# Patient Record
Sex: Male | Born: 1980 | Race: White | Hispanic: No | Marital: Married | Smoking: Current some day smoker
Health system: Southern US, Community
[De-identification: ages and names within clinical notes are randomized; demographics above are authoritative.]

## PROBLEM LIST (undated history)

## (undated) DIAGNOSIS — F419 Anxiety disorder, unspecified: Secondary | ICD-10-CM

## (undated) DIAGNOSIS — J45909 Unspecified asthma, uncomplicated: Secondary | ICD-10-CM

## (undated) HISTORY — PX: TOOTH EXTRACTION: SUR596

## (undated) HISTORY — PX: FRACTURE SURGERY: SHX138

## (undated) HISTORY — DX: Anxiety disorder, unspecified: F41.9

## (undated) HISTORY — DX: Unspecified asthma, uncomplicated: J45.909

---

## 1998-01-02 ENCOUNTER — Emergency Department (HOSPITAL_COMMUNITY): Admission: EM | Admit: 1998-01-02 | Discharge: 1998-01-02 | Payer: Self-pay | Admitting: Internal Medicine

## 1998-01-04 ENCOUNTER — Encounter: Admission: RE | Admit: 1998-01-04 | Discharge: 1998-04-04 | Payer: Self-pay | Admitting: Internal Medicine

## 1998-01-04 ENCOUNTER — Encounter: Admission: RE | Admit: 1998-01-04 | Discharge: 1998-01-04 | Payer: Self-pay | Admitting: *Deleted

## 1998-06-29 ENCOUNTER — Emergency Department (HOSPITAL_COMMUNITY): Admission: EM | Admit: 1998-06-29 | Discharge: 1998-06-30 | Payer: Self-pay | Admitting: Emergency Medicine

## 2000-01-24 ENCOUNTER — Emergency Department (HOSPITAL_COMMUNITY): Admission: EM | Admit: 2000-01-24 | Discharge: 2000-01-24 | Payer: Self-pay | Admitting: Emergency Medicine

## 2001-12-04 ENCOUNTER — Emergency Department (HOSPITAL_COMMUNITY): Admission: EM | Admit: 2001-12-04 | Discharge: 2001-12-04 | Payer: Self-pay | Admitting: Emergency Medicine

## 2002-01-07 ENCOUNTER — Encounter: Payer: Self-pay | Admitting: Orthopedic Surgery

## 2002-01-07 ENCOUNTER — Encounter: Admission: RE | Admit: 2002-01-07 | Discharge: 2002-01-07 | Payer: Self-pay | Admitting: Orthopedic Surgery

## 2004-03-31 ENCOUNTER — Emergency Department (HOSPITAL_COMMUNITY): Admission: EM | Admit: 2004-03-31 | Discharge: 2004-03-31 | Payer: Self-pay | Admitting: *Deleted

## 2006-10-19 ENCOUNTER — Encounter: Admission: RE | Admit: 2006-10-19 | Discharge: 2006-10-19 | Payer: Self-pay | Admitting: Endocrinology

## 2011-05-29 ENCOUNTER — Other Ambulatory Visit: Payer: Self-pay | Admitting: Family Medicine

## 2011-05-29 DIAGNOSIS — N5089 Other specified disorders of the male genital organs: Secondary | ICD-10-CM

## 2011-06-05 ENCOUNTER — Ambulatory Visit
Admission: RE | Admit: 2011-06-05 | Discharge: 2011-06-05 | Disposition: A | Discharge status: Home / Self Care | Payer: 59 | Source: Ambulatory Visit | Attending: Family Medicine | Admitting: Family Medicine

## 2011-06-05 DIAGNOSIS — N5089 Other specified disorders of the male genital organs: Secondary | ICD-10-CM

## 2013-01-02 ENCOUNTER — Telehealth: Payer: Self-pay

## 2013-01-02 ENCOUNTER — Ambulatory Visit (INDEPENDENT_AMBULATORY_CARE_PROVIDER_SITE_OTHER): Payer: 59 | Admitting: Family Medicine

## 2013-01-02 VITALS — BP 125/68 | HR 61 | Temp 97.5°F | Resp 18 | Ht 70.25 in | Wt 190.4 lb

## 2013-01-02 DIAGNOSIS — K0889 Other specified disorders of teeth and supporting structures: Secondary | ICD-10-CM

## 2013-01-02 DIAGNOSIS — K089 Disorder of teeth and supporting structures, unspecified: Secondary | ICD-10-CM

## 2013-01-02 DIAGNOSIS — R55 Syncope and collapse: Secondary | ICD-10-CM

## 2013-01-02 DIAGNOSIS — K047 Periapical abscess without sinus: Secondary | ICD-10-CM

## 2013-01-02 DIAGNOSIS — G4700 Insomnia, unspecified: Secondary | ICD-10-CM

## 2013-01-02 MED ORDER — OXYCODONE-ACETAMINOPHEN 10-325 MG PO TABS: 1.0000 | ORAL_TABLET | Freq: Three times a day (TID) | ORAL | Status: DC | PRN

## 2013-01-02 MED ORDER — AMOXICILLIN 500 MG PO CAPS: 500.0000 mg | ORAL_CAPSULE | Freq: Three times a day (TID) | ORAL | Status: DC

## 2013-01-02 MED ORDER — AMOXICILLIN 500 MG PO CAPS: 500.0000 mg | ORAL_CAPSULE | Freq: Two times a day (BID) | ORAL | Status: DC

## 2013-01-02 MED ORDER — ZOLPIDEM TARTRATE 5 MG PO TABS: 5.0000 mg | ORAL_TABLET | Freq: Every evening | ORAL | Status: DC | PRN

## 2013-01-02 MED ORDER — LIDOCAINE VISCOUS 2 % MT SOLN: 20.0000 mL | OROMUCOSAL | Status: DC | PRN

## 2013-01-02 NOTE — Progress Notes (Signed)
[facility]:  Office Visit  Chief Complaint:  Chief Complaint  Patient presents with  . Dental Pain    R lower x 2 dys    HPI: Justin Underwood is a 32 y.o. male who complains of  Right tooth pain, sees Dr Maurice March and associates. Has had cavities with fillings and has multipe procedures recently but not working so dentist recommended root canal, and is supposed to get emergency root canal on Monday. Can't eat, can't sleep. Has tried tylenol, tylox, unisom, oragel, advil without relief. He has had pain before with broken arms, cut his finger but nothing has hurt him like this. He had has had chills, no fevers, + drainage and jaw pain and mild swelling  Past Medical History  Diagnosis Date  . Asthma    Past Surgical History  Procedure Laterality Date  . Fracture surgery     History   Social History  . Marital Status: Single    Spouse Name: N/A    Number of Children: N/A  . Years of Education: N/A   Social History Main Topics  . Smoking status: Never Smoker   . Smokeless tobacco: None  . Alcohol Use: Yes     Comment: socially  . Drug Use: No  . Sexually Active: Yes   Other Topics Concern  . None   Social History Narrative  . None   History reviewed. No pertinent family history. No Known Allergies Prior to Admission medications   Not on File     ROS: The patient denies night sweats, unintentional weight loss, chest pain, palpitations, wheezing, dyspnea on exertion, nausea, vomiting, abdominal pain, dysuria, hematuria, melena, numbness, weakness, or tingling.   All other systems have been reviewed and were otherwise negative with the exception of those mentioned in the HPI and as above.    PHYSICAL EXAM: Filed Vitals:   01/02/13 0750  BP: 125/68  Pulse: 61  Temp: 97.5 F (36.4 C)  Resp: 18   Filed Vitals:   01/02/13 0750  Height: 5' 10.25" (1.784 m)  Weight: 190 lb 6.4 oz (86.365 kg)   Body mass index is 27.14 kg/(m^2).  General: Alert,  no acute distress HEENT:  Normocephalic, atraumatic, oropharynx patent. + right lower molar cavity with filling, yellow drainage on tongue, tender jaw. Cardiovascular:  Regular rate and rhythm, no rubs murmurs or gallops.  No pedal edema.  Respiratory: Clear to auscultation bilaterally.  No wheezes, rales, or rhonchi.  No cyanosis, no use of accessory musculature GI: No organomegaly, abdomen is soft and non-tender, positive bowel sounds.  No masses. Skin: No rashes. Neurologic: Facial musculature symmetric. Psychiatric: Patient is appropriate throughout our interaction. Lymphatic: No cervical lymphadenopathy Musculoskeletal: Gait intact.   LABS: No results found for this or any previous visit.   EKG/XRAY:   Primary read interpreted by Dr. Conley Rolls at [facility].   ASSESSMENT/PLAN: Encounter Diagnoses  Name Primary?  Marland Kitchen Tooth abscess Yes  . Tooth pain   . Insomnia    Advise to go see dentist on Monday as planned Will rx Amox 500 mg BID for infection prevention Rx ambien for sleep prn Rx Oxycodone/Acetaminophen for pain prn Rx viscous lidocaine 2% for topical pain prn Gross sideeffects, risk and benefits, and alternatives of medications d/w patient. Patient is aware that all medications have potential sideeffects and we are unable to predict every sideeffect or drug-drug interaction that may occur. F/u prn   Lemar Bakos PHUONG, DO 01/02/2013 8:23 AM

## 2013-01-02 NOTE — Telephone Encounter (Signed)
Pharmacy called stating that they received two rx of amoxicillian with two different directions and they need clarification  Best number [phone]

## 2013-01-02 NOTE — Telephone Encounter (Signed)
Pharmacy notified amox bid is correct dose.

## 2013-04-15 ENCOUNTER — Encounter: Payer: Self-pay | Admitting: Radiology

## 2013-04-15 DIAGNOSIS — L989 Disorder of the skin and subcutaneous tissue, unspecified: Secondary | ICD-10-CM | POA: Insufficient documentation

## 2013-05-06 ENCOUNTER — Ambulatory Visit (INDEPENDENT_AMBULATORY_CARE_PROVIDER_SITE_OTHER): Payer: 59 | Admitting: Emergency Medicine

## 2013-05-06 VITALS — BP 126/80 | HR 59 | Temp 98.3°F | Resp 16 | Ht 71.5 in | Wt 189.0 lb

## 2013-05-06 DIAGNOSIS — H9190 Unspecified hearing loss, unspecified ear: Secondary | ICD-10-CM

## 2013-05-06 DIAGNOSIS — H9192 Unspecified hearing loss, left ear: Secondary | ICD-10-CM

## 2013-05-06 MED ORDER — PREDNISONE 10 MG PO TABS: ORAL_TABLET | ORAL | Status: DC
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
## 2014-03-02 ENCOUNTER — Ambulatory Visit (INDEPENDENT_AMBULATORY_CARE_PROVIDER_SITE_OTHER): Payer: 59 | Admitting: Family Medicine

## 2014-03-02 VITALS — BP 130/78 | HR 63 | Temp 97.4°F | Resp 16 | Ht 71.0 in | Wt 180.0 lb

## 2014-03-02 DIAGNOSIS — H109 Unspecified conjunctivitis: Secondary | ICD-10-CM

## 2014-03-02 DIAGNOSIS — H0011 Chalazion right upper eyelid: Secondary | ICD-10-CM

## 2014-03-02 DIAGNOSIS — H0019 Chalazion unspecified eye, unspecified eyelid: Secondary | ICD-10-CM

## 2014-03-02 MED ORDER — CIPROFLOXACIN HCL 0.3 % OP SOLN: OPHTHALMIC | Status: DC

## 2014-03-02 NOTE — Patient Instructions (Signed)
You may have a "chalazion" or swelling of one of the glands in the eyelid, but your symptoms also may suggest conjunctivitis that has been treated intermittently.  Start warm compresses over right upper eyelid, eye drops as instructed - finish the entire week of antibiotics.  If not improving in next week, can have Dr. Dione BoozeGroat see you for evaluation. Return to the clinic or go to the nearest emergency room if any of your symptoms worsen or new symptoms occur.  Conjunctivitis Conjunctivitis is commonly called "pink eye." Conjunctivitis can be caused by bacterial or viral infection, allergies, or injuries. There is usually redness of the lining of the eye, itching, discomfort, and sometimes discharge. There may be deposits of matter along the eyelids. A viral infection usually causes a watery discharge, while a bacterial infection causes a yellowish, thick discharge. Pink eye is very contagious and spreads by direct contact. You may be given antibiotic eyedrops as part of your treatment. Before using your eye medicine, remove all drainage from the eye by washing gently with warm water and cotton balls. Continue to use the medication until you have awakened 2 mornings in a row without discharge from the eye. Do not rub your eye. This increases the irritation and helps spread infection. Use separate towels from other household members. Wash your hands with soap and water before and after touching your eyes. Use cold compresses to reduce pain and sunglasses to relieve irritation from light. Do not wear contact lenses or wear eye makeup until the infection is gone. SEEK MEDICAL CARE IF:   Your symptoms are not better after 3 days of treatment.  You have increased pain or trouble seeing.  The outer eyelids become very red or swollen. Document Released: 08/28/2004 Document Revised: 10/13/2011 Document Reviewed: 07/21/2005 [facility] Patient Information 2015 [employer], [place]. This information is not intended to  replace advice given to you by your health care provider. Make sure you discuss any questions you have with your health care provider.   Chalazion A chalazion is a swelling or hard lump on the eyelid caused by a blocked oil gland. Chalazions may occur on the upper or the lower eyelid.  CAUSES  Oil gland in the eyelid becomes blocked. SYMPTOMS   Swelling or hard lump on the eyelid. This lump may make it hard to see out of the eye.  The swelling may spread to areas around the eye. TREATMENT   Although some chalazions disappear by themselves in 1 or 2 months, some chalazions may need to be removed.  Medicines to treat an infection may be required. HOME CARE INSTRUCTIONS   Wash your hands often and dry them with a clean towel. Do not touch the chalazion.  Apply heat to the eyelid several times a day for 10 minutes to help ease discomfort and bring any yellowish white fluid (pus) to the surface. One way to apply heat to a chalazion is to use the handle of a metal spoon.  Hold the handle under hot water until it is hot, and then wrap the handle in paper towels so that the heat can come through without burning your skin.  Hold the wrapped handle against the chalazion and reheat the spoon handle as needed.  Apply heat in this fashion for 10 minutes, 4 times per day.  Return to your caregiver to have the pus removed if it does not break (rupture) on its own.  Do not try to remove the pus yourself by squeezing the chalazion or sticking  it with a pin or needle.  Only take over-the-counter or prescription medicines for pain, discomfort, or fever as directed by your caregiver. SEEK IMMEDIATE MEDICAL CARE IF:   You have pain in your eye.  Your vision changes.  The chalazion does not go away.  The chalazion becomes painful, red, or swollen, grows larger, or does not start to disappear after 2 weeks. MAKE SURE YOU:   Understand these instructions.  Will watch your condition.  Will  get help right away if you are not doing well or get worse. Document Released: 07/18/2000 Document Revised: 10/13/2011 Document Reviewed: 11/05/2009 [facility] Patient Information 2015 [place], [place]. This information is not intended to replace advice given to you by your health care provider. Make sure you discuss any questions you have with your health care provider.

## 2014-03-02 NOTE — Progress Notes (Signed)
Subjective:    Patient ID: Justin Underwood, male    DOB: 09/02/1980, 33 y.o.   MRN: [id number]  HPI Justin Underwood is a 33 y.o. male  [place] - got "pink eye" July 5th - right eye crusting in the morning and day,light sensitive. No contact lenses.  No other known sick contacts. had leftover tobramycin (expired 12/2013). Used gtts tid until improved sx's - 4-5 days, went away for 3 days.  Noticed sty on top lid - ruptured stye with qtip, noticed new one next day - ruptured this with qtip as well. Tried erythromycin ointment once per day, and restarted tobramycin as symptoms had returned - crusting and redness. Used for 3 days, then stopped as sx's resolved.  Past 4 days noticed return of discharge and light sensitivity, dryness, blurry at times in R eye. Now 4 days, and feels swollen or puffy in r eye.   Has seen Dr. Laruth BouchardGroat's office in past for optho care.   SH: cabinetry. No known fb recently in eye.    Patient Active Problem List   Diagnosis Date Noted  . Benign skin lesion of nose 04/15/2013   Past Medical History  Diagnosis Date  . Asthma   . Anxiety    Past Surgical History  Procedure Laterality Date  . Fracture surgery    . Tooth extraction     No Known Allergies Prior to Admission medications   Not on File   History   Social History  . Marital Status: Single    Spouse Name: N/A    Number of Children: N/A  . Years of Education: N/A   Occupational History  . Not on file.   Social History Main Topics  . Smoking status: Never Smoker   . Smokeless tobacco: Not on file  . Alcohol Use: Yes     Comment: socially  . Drug Use: No  . Sexual Activity: Yes   Other Topics Concern  . Not on file   Social History Narrative  . No narrative on file       Review of Systems  Constitutional: Negative for fever and chills.  HENT: Negative for congestion and rhinorrhea.   Eyes: Positive for photophobia, discharge, redness and visual disturbance.      Objective:   Physical Exam  Vitals reviewed. Constitutional: He is oriented to person, place, and time. He appears well-developed and well-nourished.  HENT:  Head: Normocephalic and atraumatic.  Right Ear: Tympanic membrane, external ear and ear canal normal.  Left Ear: Tympanic membrane, external ear and ear canal normal.  Nose: No rhinorrhea.  Mouth/Throat: Oropharynx is clear and moist and mucous membranes are normal. No oropharyngeal exudate or posterior oropharyngeal erythema.  Eyes: Conjunctivae are normal. Pupils are equal, round, and reactive to light.    Anterior chamber clear,  Proparacaine 2 gtts applied.  Lids everted, no foreign body identified. Fluorescein applied, no uptake.  Flushed with sterile water, no complications.   Neck: Neck supple.  Cardiovascular: Normal rate, regular rhythm, normal heart sounds and intact distal pulses.   No murmur heard. Pulmonary/Chest: Effort normal and breath sounds normal. He has no wheezes. He has no rhonchi. He has no rales.  Abdominal: Soft. There is no tenderness.  Lymphadenopathy:    He has no cervical adenopathy.  Neurological: He is alert and oriented to person, place, and time.  Skin: Skin is warm and dry. No rash noted.  Psychiatric: He has a normal mood and  affect. His behavior is normal.   Filed Vitals:   03/02/14 0825  BP: 130/78  Pulse: 63  Temp: 97.4 F (36.3 C)  TempSrc: Oral  Resp: 16  Height: 5\' 11"  (1.803 m)  Weight: 180 lb (81.647 kg)  SpO2: 100%        Assessment & Plan:   Justin Underwood is a 33 y.o. male Conjunctivitis, right eye - Plan: ciprofloxacin (CILOXAN) 0.3 % ophthalmic solution  Chalazion of right upper eyelid - Plan: ciprofloxacin (CILOXAN) 0.3 % ophthalmic solution  Partially treated episodes of conjunctivitis, but then self treatment of probable upper chalazion. may have component of scarring or resultant inflammation in that area.   -ciloxan gtts, warm compresses, then recheck with  Dr. Dione Booze in next week or two if not improving - sooner if worse.   Meds ordered this encounter  Medications  . ciprofloxacin (CILOXAN) 0.3 % ophthalmic solution    Sig: Administer 1 drop, every 2 hours, while awake, for 2 days. Then 1 drop, every 4 hours, while awake, for the next 5 days.    Dispense:  5 mL    Refill:  0   Patient Instructions  You may have a "chalazion" or swelling of one of the glands in the eyelid, but your symptoms also may suggest conjunctivitis that has been treated intermittently.  Start warm compresses over right upper eyelid, eye drops as instructed - finish the entire week of antibiotics.  If not improving in next week, can have Dr. Dione Booze see you for evaluation. Return to the clinic or go to the nearest emergency room if any of your symptoms worsen or new symptoms occur.  Conjunctivitis Conjunctivitis is commonly called "pink eye." Conjunctivitis can be caused by bacterial or viral infection, allergies, or injuries. There is usually redness of the lining of the eye, itching, discomfort, and sometimes discharge. There may be deposits of matter along the eyelids. A viral infection usually causes a watery discharge, while a bacterial infection causes a yellowish, thick discharge. Pink eye is very contagious and spreads by direct contact. You may be given antibiotic eyedrops as part of your treatment. Before using your eye medicine, remove all drainage from the eye by washing gently with warm water and cotton balls. Continue to use the medication until you have awakened 2 mornings in a row without discharge from the eye. Do not rub your eye. This increases the irritation and helps spread infection. Use separate towels from other household members. Wash your hands with soap and water before and after touching your eyes. Use cold compresses to reduce pain and sunglasses to relieve irritation from light. Do not wear contact lenses or wear eye makeup until the infection is  gone. SEEK MEDICAL CARE IF:   Your symptoms are not better after 3 days of treatment.  You have increased pain or trouble seeing.  The outer eyelids become very red or swollen. Document Released: 08/28/2004 Document Revised: 10/13/2011 Document Reviewed: 07/21/2005 [facility] Patient Information 2015 [place], [place]. This information is not intended to replace advice given to you by your health care provider. Make sure you discuss any questions you have with your health care provider.   Chalazion A chalazion is a swelling or hard lump on the eyelid caused by a blocked oil gland. Chalazions may occur on the upper or the lower eyelid.  CAUSES  Oil gland in the eyelid becomes blocked. SYMPTOMS   Swelling or hard lump on the eyelid. This lump may make it hard to  see out of the eye.  The swelling may spread to areas around the eye. TREATMENT   Although some chalazions disappear by themselves in 1 or 2 months, some chalazions may need to be removed.  Medicines to treat an infection may be required. HOME CARE INSTRUCTIONS   Wash your hands often and dry them with a clean towel. Do not touch the chalazion.  Apply heat to the eyelid several times a day for 10 minutes to help ease discomfort and bring any yellowish white fluid (pus) to the surface. One way to apply heat to a chalazion is to use the handle of a metal spoon.  Hold the handle under hot water until it is hot, and then wrap the handle in paper towels so that the heat can come through without burning your skin.  Hold the wrapped handle against the chalazion and reheat the spoon handle as needed.  Apply heat in this fashion for 10 minutes, 4 times per day.  Return to your caregiver to have the pus removed if it does not break (rupture) on its own.  Do not try to remove the pus yourself by squeezing the chalazion or sticking it with a pin or needle.  Only take over-the-counter or prescription medicines for pain, discomfort, or  fever as directed by your caregiver. SEEK IMMEDIATE MEDICAL CARE IF:   You have pain in your eye.  Your vision changes.  The chalazion does not go away.  The chalazion becomes painful, red, or swollen, grows larger, or does not start to disappear after 2 weeks. MAKE SURE YOU:   Understand these instructions.  Will watch your condition.  Will get help right away if you are not doing well or get worse. Document Released: 07/18/2000 Document Revised: 10/13/2011 Document Reviewed: 11/05/2009 [facility] Patient Information 2015 [place], [place]. This information is not intended to replace advice given to you by your health care provider. Make sure you discuss any questions you have with your health care provider.

## 2014-04-01 ENCOUNTER — Emergency Department (HOSPITAL_BASED_OUTPATIENT_CLINIC_OR_DEPARTMENT_OTHER)
Admission: EM | Admit: 2014-04-01 | Discharge: 2014-04-01 | Disposition: A | Discharge status: Home / Self Care | Payer: PRIVATE HEALTH INSURANCE | Attending: Emergency Medicine | Admitting: Emergency Medicine

## 2014-04-01 DIAGNOSIS — IMO0002 Reserved for concepts with insufficient information to code with codable children: Secondary | ICD-10-CM | POA: Diagnosis not present

## 2014-04-01 DIAGNOSIS — S0180XA Unspecified open wound of other part of head, initial encounter: Secondary | ICD-10-CM | POA: Insufficient documentation

## 2014-04-01 DIAGNOSIS — S0190XA Unspecified open wound of unspecified part of head, initial encounter: Secondary | ICD-10-CM | POA: Diagnosis present

## 2014-04-01 DIAGNOSIS — Z792 Long term (current) use of antibiotics: Secondary | ICD-10-CM | POA: Insufficient documentation

## 2014-04-01 DIAGNOSIS — Y9389 Activity, other specified: Secondary | ICD-10-CM | POA: Diagnosis not present

## 2014-04-01 DIAGNOSIS — Y929 Unspecified place or not applicable: Secondary | ICD-10-CM | POA: Diagnosis not present

## 2014-04-01 DIAGNOSIS — Z8659 Personal history of other mental and behavioral disorders: Secondary | ICD-10-CM | POA: Insufficient documentation

## 2014-04-01 DIAGNOSIS — S01112A Laceration without foreign body of left eyelid and periocular area, initial encounter: Secondary | ICD-10-CM

## 2014-04-01 DIAGNOSIS — J45909 Unspecified asthma, uncomplicated: Secondary | ICD-10-CM | POA: Insufficient documentation

## 2014-04-01 MED ORDER — TETANUS-DIPHTH-ACELL PERTUSSIS 5-2.5-18.5 LF-MCG/0.5 IM SUSP: 0.5000 mL | Freq: Once | INTRAMUSCULAR | Status: DC

## 2014-04-01 NOTE — ED Notes (Signed)
I irrigated wound, room prepped for procedure.

## 2014-04-01 NOTE — Discharge Instructions (Signed)
Facial Laceration  A facial laceration is a cut on the face. These injuries can be painful and cause bleeding. Lacerations usually heal quickly, but they need special care to reduce scarring. DIAGNOSIS  Your health care provider will take a medical history, ask for details about how the injury occurred, and examine the wound to determine how deep the cut is. TREATMENT  Some facial lacerations may not require closure. Others may not be able to be closed because of an increased risk of infection. The risk of infection and the chance for successful closure will depend on various factors, including the amount of time since the injury occurred. The wound may be cleaned to help prevent infection. If closure is appropriate, pain medicines may be given if needed. Your health care provider will use stitches (sutures), wound glue (adhesive), or skin adhesive strips to repair the laceration. These tools bring the skin edges together to allow for faster healing and a better cosmetic outcome. If needed, you may also be given a tetanus shot. HOME CARE INSTRUCTIONS  Only take over-the-counter or prescription medicines as directed by your health care provider.  Follow your health care provider's instructions for wound care. These instructions will vary depending on the technique used for closing the wound. For Sutures:  Keep the wound clean and dry.   If you were given a bandage (dressing), you should change it at least once a day. Also change the dressing if it becomes wet or dirty, or as directed by your health care provider.   Wash the wound with soap and water 2 times a day. Rinse the wound off with water to remove all soap. Pat the wound dry with a clean towel.   After cleaning, apply a thin layer of the antibiotic ointment recommended by your health care provider. This will help prevent infection and keep the dressing from sticking.   You may shower as usual after the first 24 hours. Do not soak the  wound in water until the sutures are removed.   Get your sutures removed as directed by your health care provider. With facial lacerations, sutures should usually be taken out after 4-5 days to avoid stitch marks.   Wait a few days after your sutures are removed before applying any makeup. For Skin Adhesive Strips:  Keep the wound clean and dry.   Do not get the skin adhesive strips wet. You may bathe carefully, using caution to keep the wound dry.   If the wound gets wet, pat it dry with a clean towel.   Skin adhesive strips will fall off on their own. You may trim the strips as the wound heals. Do not remove skin adhesive strips that are still stuck to the wound. They will fall off in time.  For Wound Adhesive:  You may briefly wet your wound in the shower or bath. Do not soak or scrub the wound. Do not swim. Avoid periods of heavy sweating until the skin adhesive has fallen off on its own. After showering or bathing, gently pat the wound dry with a clean towel.   Do not apply liquid medicine, cream medicine, ointment medicine, or makeup to your wound while the skin adhesive is in place. This may loosen the film before your wound is healed.   If a dressing is placed over the wound, be careful not to apply tape directly over the skin adhesive. This may cause the adhesive to be pulled off before the wound is healed.   Avoid   prolonged exposure to sunlight or tanning lamps while the skin adhesive is in place.  The skin adhesive will usually remain in place for 5-10 days, then naturally fall off the skin. Do not pick at the adhesive film.  After Healing: Once the wound has healed, cover the wound with sunscreen during the day for 1 full year. This can help minimize scarring. Exposure to ultraviolet light in the first year will darken the scar. It can take 1-2 years for the scar to lose its redness and to heal completely.  SEEK IMMEDIATE MEDICAL CARE IF:  You have redness, pain, or  swelling around the wound.   You see ayellowish-white fluid (pus) coming from the wound.   You have chills or a fever.  MAKE SURE YOU:  Understand these instructions.  Will watch your condition.  Will get help right away if you are not doing well or get worse. Document Released: 08/28/2004 Document Revised: 05/11/2013 Document Reviewed: 03/03/2013 [employer] Patient Information 2015 [employer]. This information is not intended to replace advice given to you by your health care provider. Make sure you discuss any questions you have with your health care provider.  
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
## 2014-04-01 NOTE — ED Provider Notes (Signed)
CSN: [id number]     Arrival date & time 04/01/14  2059 History   First MD Initiated Contact with Patient 04/01/14 2114     Chief Complaint  Patient presents with  . Head Laceration     (Consider location/radiation/quality/duration/timing/severity/associated sxs/prior Treatment) Patient is a 33 y.o. male presenting with scalp laceration. The history is provided by the patient. No language interpreter was used.  Head Laceration This is a new problem. The current episode started today. The problem occurs constantly. The problem has been gradually worsening. Pertinent negatives include no joint swelling, nausea or neck pain. Nothing aggravates the symptoms. He has tried nothing for the symptoms. The treatment provided no relief.    Past Medical History  Diagnosis Date  . Asthma   . Anxiety    Past Surgical History  Procedure Laterality Date  . Fracture surgery    . Tooth extraction     No family history on file. History  Substance Use Topics  . Smoking status: Never Smoker   . Smokeless tobacco: Not on file  . Alcohol Use: Yes     Comment: socially    Review of Systems  Gastrointestinal: Negative for nausea.  Musculoskeletal: Negative for joint swelling and neck pain.  All other systems reviewed and are negative.     Allergies  Review of patient's allergies indicates no known allergies.  Home Medications   Prior to Admission medications   Medication Sig Start Date End Date Taking? Authorizing Provider  ciprofloxacin (CILOXAN) 0.3 % ophthalmic solution Administer 1 drop, every 2 hours, while awake, for 2 days. Then 1 drop, every 4 hours, while awake, for the next 5 days. 03/02/14   Shade Flood, MD   BP 135/98  Pulse 82  Temp(Src) 97.5 F (36.4 C) (Oral)  Resp 18  SpO2 100% Physical Exam  Nursing note and vitals reviewed. Constitutional: He appears well-developed.  HENT:  Head: Normocephalic.  3cm laceration left eyebrow,  Eyes: Conjunctivae and EOM are  normal. Pupils are equal, round, and reactive to light.  Neck: Normal range of motion.  Cardiovascular: Normal rate.   Pulmonary/Chest: Effort normal.  Neurological: He is alert.  Skin: Skin is warm.  Psychiatric: He has a normal mood and affect.    ED Course  LACERATION REPAIR Date/Time: 04/01/2014 10:40 PM Performed by: Elson Areas Authorized by: Elson Areas Consent: Verbal consent not obtained. Risks and benefits: risks, benefits and alternatives were discussed Consent given by: patient Patient understanding: patient does not state understanding of the procedure being performed Required items: required blood products, implants, devices, and special equipment available Patient identity confirmed: verbally with patient Time out: Immediately prior to procedure a "time out" was called to verify the correct patient, procedure, equipment, support staff and site/side marked as required. Body area: head/neck Location details: left eyebrow Laceration length: 3 cm Tendon involvement: none Nerve involvement: none Anesthesia: local infiltration Anesthetic total: 8 ml Patient sedated: no Preparation: Patient was prepped and draped in the usual sterile fashion. Irrigation solution: saline Amount of cleaning: standard Debridement: none Degree of undermining: none Skin closure: 6-0 Prolene Number of sutures: 8 Technique: simple Approximation: loose Approximation difficulty: simple Patient tolerance: Patient tolerated the procedure well with no immediate complications.   (including critical care time) Labs Review Labs Reviewed - No data to display  Imaging Review No results found.   EKG Interpretation None      MDM   Final diagnoses:  Laceration of eyebrow, left, initial encounter  Pt advised suture removal in 6 days.  Ice to area of swelling. Pt declined pain medication.     Lonia Skinner [place], PA-C 04/01/14 2242

## 2014-04-02 NOTE — ED Provider Notes (Signed)
Medical screening examination/treatment/procedure(s) were performed by non-physician practitioner and as supervising physician I was immediately available for consultation/collaboration.   EKG Interpretation None        Gilda Crease, MD 04/02/14 [phone]

## 2015-04-10 ENCOUNTER — Encounter: Payer: Self-pay | Admitting: Podiatry

## 2015-04-10 ENCOUNTER — Ambulatory Visit: Payer: Self-pay

## 2015-04-10 DIAGNOSIS — M722 Plantar fascial fibromatosis: Secondary | ICD-10-CM

## 2015-04-10 NOTE — Progress Notes (Signed)
This encounter was created in error - please disregard.

## 2015-04-10 NOTE — Patient Instructions (Signed)

## 2016-03-11 ENCOUNTER — Ambulatory Visit (INDEPENDENT_AMBULATORY_CARE_PROVIDER_SITE_OTHER): Payer: BLUE CROSS/BLUE SHIELD | Admitting: Family Medicine

## 2016-03-11 VITALS — BP 124/82 | HR 60 | Temp 98.3°F | Resp 18 | Ht 71.0 in | Wt 181.0 lb

## 2016-03-11 DIAGNOSIS — F908 Attention-deficit hyperactivity disorder, other type: Secondary | ICD-10-CM

## 2016-03-11 DIAGNOSIS — F909 Attention-deficit hyperactivity disorder, unspecified type: Secondary | ICD-10-CM | POA: Insufficient documentation

## 2016-03-11 DIAGNOSIS — F17200 Nicotine dependence, unspecified, uncomplicated: Secondary | ICD-10-CM

## 2016-03-11 MED ORDER — NICOTINE POLACRILEX 4 MG MT GUM: 4.0000 mg | CHEWING_GUM | OROMUCOSAL | 6 refills | Status: DC | PRN

## 2016-03-11 MED ORDER — AMPHETAMINE-DEXTROAMPHETAMINE 5 MG PO TABS: 10.0000 mg | ORAL_TABLET | Freq: Every day | ORAL | 0 refills | Status: DC

## 2016-03-11 NOTE — Patient Instructions (Addendum)
Follow-up in 4 weeks for ADHD    IF you received an x-ray today, you will receive an invoice from [facility]. Please contact [facility] at [phone] with questions or concerns regarding your invoice.   IF you received labwork today, you will receive an invoice from [employer]. Please contact [facility] at [phone] with questions or concerns regarding your invoice.   Our billing staff will not be able to assist you with questions regarding bills from these companies.  You will be contacted with the lab results as soon as they are available. The fastest way to get your results is to activate your My Chart account. Instructions are located on the last page of this paperwork. If you have not heard from [place] regarding the results in 2 weeks, please contact this office.    SMOKING CESSATIOAttention Deficit Hyperactivity Disorder Attention deficit hyperactivity disorder (ADHD) is a problem with behavior issues based on the way the brain functions (neurobehavioral disorder). It is a common reason for behavior and academic problems in school. SYMPTOMS  There are 3 types of ADHD. The 3 types and some of the symptoms include:  Inattentive.  Gets bored or distracted easily.  Loses or forgets things. Forgets to hand in homework.  Has trouble organizing or completing tasks.  Difficulty staying on task.  An inability to organize daily tasks and school work.  Leaving projects, chores, or homework unfinished.  Trouble paying attention or responding to details. Careless mistakes.  Difficulty following directions. Often seems like is not listening.  Dislikes activities that require sustained attention (like chores or homework).  Hyperactive-impulsive.  Feels like it is impossible to sit still or stay in a seat. Fidgeting with hands and feet.  Trouble waiting turn.  Talking too much or out of turn. Interruptive.  Speaks or acts  impulsively.  Aggressive, disruptive behavior.  Constantly busy or on the go; noisy.  Often leaves seat when they are expected to remain seated.  Often runs or climbs where it is not appropriate, or feels very restless.  Combined.  Has symptoms of both of the above. Often children with ADHD feel discouraged about themselves and with school. They often perform well below their abilities in school. As children get older, the excess motor activities can calm down, but the problems with paying attention and staying organized persist. Most children do not outgrow ADHD but with good treatment can learn to cope with the symptoms. DIAGNOSIS  When ADHD is suspected, the diagnosis should be made by professionals trained in ADHD. This professional will collect information about the individual suspected of having ADHD. Information must be collected from various settings where the person lives, works, or attends school.  Diagnosis will include:  Confirming symptoms began in childhood.  Ruling out other reasons for the child's behavior.  The health care providers will check with the child's school and check their medical records.  They will talk to teachers and parents.  Behavior rating scales for the child will be filled out by those dealing with the child on a daily basis. A diagnosis is made only after all information has been considered. TREATMENT  Treatment usually includes behavioral treatment, tutoring or extra support in school, and stimulant medicines. Because of the way a person's brain works with ADHD, these medicines decrease impulsivity and hyperactivity and increase attention. This is different than how they would work in a person who does not have ADHD. Other medicines used include antidepressants and certain blood pressure medicines. Most  experts agree that treatment for ADHD should address all aspects of the person's functioning. Along with medicines, treatment should include  structured classroom management at school. Parents should reward good behavior, provide constant discipline, and set limits. Tutoring should be available for the child as needed. ADHD is a lifelong condition. If untreated, the disorder can have long-term serious effects into adolescence and adulthood. HOME CARE INSTRUCTIONS   Often with ADHD there is a lot of frustration among family members dealing with the condition. Blame and anger are also feelings that are common. In many cases, because the problem affects the family as a whole, the entire family may need help. A therapist can help the family find better ways to handle the disruptive behaviors of the person with ADHD and promote change. If the person with ADHD is young, most of the therapist's work is with the parents. Parents will learn techniques for coping with and improving their child's behavior. Sometimes only the child with the ADHD needs counseling. Your health care providers can help you make these decisions.  Children with ADHD may need help learning how to organize. Some helpful tips include:  Keep routines the same every day from wake-up time to bedtime. Schedule all activities, including homework and playtime. Keep the schedule in a place where the person with ADHD will often see it. Mark schedule changes as far in advance as possible.  Schedule outdoor and indoor recreation.  Have a place for everything and keep everything in its place. This includes clothing, backpacks, and school supplies.  Encourage writing down assignments and bringing home needed books. Work with your child's teachers for assistance in organizing school work.  Offer your child a well-balanced diet. Breakfast that includes a balance of whole grains, protein, and fruits or vegetables is especially important for school performance. Children should avoid drinks with caffeine including:  Soft drinks.  Coffee.  Tea.  However, some older children  (adolescents) may find these drinks helpful in improving their attention. Because it can also be common for adolescents with ADHD to become addicted to caffeine, talk with your health care provider about what is a safe amount of caffeine intake for your child.  Children with ADHD need consistent rules that they can understand and follow. If rules are followed, give small rewards. Children with ADHD often receive, and expect, criticism. Look for good behavior and praise it. Set realistic goals. Give clear instructions. Look for activities that can foster success and self-esteem. Make time for pleasant activities with your child. Give lots of affection.  Parents are their children's greatest advocates. Learn as much as possible about ADHD. This helps you become a stronger and better advocate for your child. It also helps you educate your child's teachers and instructors if they feel inadequate in these areas. Parent support groups are often helpful. A national group with local chapters is called Children and Adults with Attention Deficit Hyperactivity Disorder (CHADD). SEEK MEDICAL CARE IF:  Your child has repeated muscle twitches, cough, or speech outbursts.  Your child has sleep problems.  Your child has a marked loss of appetite.  Your child develops depression.  Your child has new or worsening behavioral problems.  Your child develops dizziness.  Your child has a racing heart.  Your child has stomach pains.  Your child develops headaches. SEEK IMMEDIATE MEDICAL CARE IF:  Your child has been diagnosed with depression or anxiety and the symptoms seem to be getting worse.  Your child has been depressed and suddenly  appears to have increased energy or motivation.  You are worried that your child is having a bad reaction to a medication he or she is taking for ADHD.   This information is not intended to replace advice given to you by your health care provider. Make sure you discuss any  questions you have with your health care provider.   Document Released: 07/11/2002 Document Revised: 07/26/2013 Document Reviewed: 03/28/2013 Elsevier Interactive Patient Education [employer]

## 2016-03-11 NOTE — Progress Notes (Signed)
Patient ID: Justin LecherWilliam R Underwood, male    DOB: September 28, 1980, 35 y.o.   MRN: [id number]  PCP: Joaquin CourtsKimberly Tamer Baughman, FNP  Chief Complaint  Patient presents with  . Medication Problem     patient wants to get rx for smoking gum and adderall    Subjective:   HPI Presents requesting prescription for nicotine gum Stopped smoking in 2008 and now dips tobacco. His employer is transitioning into a non-tobacco facility and will cover the cost of smoking cessation therapies if he has a prescription. He reports the gum works well at diminishing his cravings and has in the past tried Wellbutrin but did not like the way the medication made him feel.  ADHD Reports previous history of ADHD managed with Adderall but stopped once he completed college about 9-10 years ago.  He reports having a difficult tine focusing, easily distracted, and misplacing items.  He admits to abusing energy drinks and caffeine beverages to increase his focus and alertness.  He expresses difficulty managing and prioritizing tasks at work. He reports difficulty at home with having too much enegy and inability to relax due to his increased levels of caffeine. He reports having to drink a couple of beers in the evening to "come-down" off the caffeine in order to achieve rest. He is requesting the lowest dose of Adderall today wither 5 or 10 mg once daily for ADHD symptom control.  . Social History   Social History  . Marital status: Single    Spouse name: N/A  . Number of children: N/A  . Years of education: N/A   Social History Main Topics  . Smoking status: Current Some Day Smoker  . Smokeless tobacco: Current User    Types: Chew, Snuff  . Alcohol use Yes     Comment: socially  . Drug use: No  . Sexual activity: Yes   Other Topics Concern  . None   Social History Narrative  . None   Review of Systems  Constitutional: Negative.   Respiratory: Negative.   Cardiovascular: Negative.   Gastrointestinal: Negative.     Psychiatric/Behavioral: Positive for decreased concentration. Negative for behavioral problems, confusion and suicidal ideas. The patient is hyperactive. The patient is not nervous/anxious.      Patient Active Problem List   Diagnosis Date Noted  . Attention deficit hyperactivity disorder (ADHD) 03/11/2016  . Benign skin lesion of nose 04/15/2013     Prior to Admission medications   Not on File   No Known Allergies     Objective:  Physical Exam  Constitutional: He is oriented to person, place, and time. He appears well-developed and well-nourished.  HENT:  Head: Normocephalic and atraumatic.  Eyes: Pupils are equal, round, and reactive to light.  Neck: Normal range of motion.  Cardiovascular: Normal rate, regular rhythm, normal heart sounds and intact distal pulses.   Pulmonary/Chest: Effort normal and breath sounds normal.  Neurological: He is alert and oriented to person, place, and time.  Skin: Skin is warm and dry.  Psychiatric: He has a normal mood and affect. Judgment and thought content normal.  Difficulty sitting still. Fidgeting while discussing problems.  Nursing note and vitals reviewed.   Vitals:   03/11/16 1123  BP: 124/82  Pulse: 60  Resp: 18  Temp: 98.3 F (36.8 C)     Assessment & Plan:  1. Tobacco use disorder . nicotine polacrilex (NICORETTE) 4 MG gum    Sig: Take 1 each (4 mg total) by mouth as needed for  smoking cessation.   2. Attention-deficit hyperactivity disorder, other type . amphetamine-dextroamphetamine (ADDERALL) 5 MG tablet    Sig: Take 2 tablets (10 mg total) by mouth daily with breakfast.    Follow-up in 4 weeks to evaluate treatment effectiveness.  Godfrey Pick. Tiburcio Pea, MSN, FNP-C [facility]

## 2016-04-14 ENCOUNTER — Ambulatory Visit (INDEPENDENT_AMBULATORY_CARE_PROVIDER_SITE_OTHER): Payer: BLUE CROSS/BLUE SHIELD | Admitting: Family Medicine

## 2016-04-14 ENCOUNTER — Telehealth: Payer: Self-pay

## 2016-04-14 VITALS — BP 130/78 | HR 69 | Temp 98.4°F | Resp 16 | Ht 71.0 in | Wt 176.0 lb

## 2016-04-14 DIAGNOSIS — F900 Attention-deficit hyperactivity disorder, predominantly inattentive type: Secondary | ICD-10-CM

## 2016-04-14 DIAGNOSIS — R05 Cough: Secondary | ICD-10-CM | POA: Diagnosis not present

## 2016-04-14 DIAGNOSIS — R059 Cough, unspecified: Secondary | ICD-10-CM

## 2016-04-14 MED ORDER — BENZONATATE 100 MG PO CAPS: 100.0000 mg | ORAL_CAPSULE | Freq: Three times a day (TID) | ORAL | 0 refills | Status: DC | PRN

## 2016-04-14 MED ORDER — NICOTINE POLACRILEX 4 MG MT GUM: 4.0000 mg | CHEWING_GUM | OROMUCOSAL | 6 refills | Status: DC | PRN

## 2016-04-14 MED ORDER — CETIRIZINE HCL 10 MG PO TABS: 10.0000 mg | ORAL_TABLET | Freq: Every day | ORAL | 11 refills | Status: AC

## 2016-04-14 MED ORDER — AMPHETAMINE-DEXTROAMPHETAMINE 5 MG PO TABS: 10.0000 mg | ORAL_TABLET | Freq: Every day | ORAL | 0 refills | Status: DC

## 2016-04-14 MED ORDER — NICOTINE POLACRILEX 4 MG MT GUM: 4.0000 mg | CHEWING_GUM | OROMUCOSAL | 6 refills | Status: AC | PRN

## 2016-04-14 MED ORDER — CETIRIZINE HCL 10 MG PO TABS: 10.0000 mg | ORAL_TABLET | Freq: Every day | ORAL | 11 refills | Status: DC

## 2016-04-14 NOTE — Progress Notes (Signed)
Patient ID: Justin LecherWilliam R Underwood, male    DOB: [birth year], 35 y.o.   MRN: [id number]  PCP: Joaquin CourtsKimberly Nyshawn Gowdy, FNP  Chief Complaint  Patient presents with  . Medication Refill    adderral, nicorette  . Cough    x 4-5 days    Subjective:   HPI  ADHD  35 year old males presents for ADHD medication refill request. Patient was last seen on March 11, 2016 and evaluated and treated for chronic ADHD. See prior note. Patient reports significant improvement of symptoms.  He feels more relaxed and focused.  Denies any changes in appetite or unintentional weight loss. Feels more relaxed. No changes in diet, makes sure he eats regularly meals. No problems with sleep. He feels his rest has improved because he goes to bed early.  Drinks more water. Feels that his body his temperature has gotten "hotter" since taking medication. He no longer drinks energy drinks. Reports that he hasn't smoked cigarettes or dipped in 2 months.  Cough   Congested, drainage and sore throat times 4 -5 days.  Reports some cough and wheeze that clears with cough.  More worrisome during the night time. He has taken some over the counter allergy medicine with minimal improvement.  Review of Systems SEE HPI  Patient Active Problem List   Diagnosis Date Noted  . Attention deficit hyperactivity disorder (ADHD) 03/11/2016  . Benign skin lesion of nose 04/15/2013     Prior to Admission medications   Medication Sig Start Date End Date Taking? Authorizing Provider  amphetamine-dextroamphetamine (ADDERALL) 5 MG tablet Take 2 tablets (10 mg total) by mouth daily with breakfast. 03/11/16  Yes Doyle AskewKimberly Stephenia Norton Bivins, FNP  nicotine polacrilex (NICORETTE) 4 MG gum Take 1 each (4 mg total) by mouth as needed for smoking cessation. 03/11/16  Yes Doyle AskewKimberly Stephenia Johne Buckle, FNP   No Known Allergies     Objective:  Physical Exam  Constitutional: He is oriented to person, place, and time. He appears well-developed and well-nourished.    HENT:  Head: Normocephalic and atraumatic.  Right Ear: External ear normal.  Left Ear: External ear normal.  Nose: Nose normal.  Mouth/Throat: Oropharynx is clear and moist. No oropharyngeal exudate.  Eyes: Conjunctivae and EOM are normal. Pupils are equal, round, and reactive to light.  Neck: Normal range of motion.  Cardiovascular: Normal rate, regular rhythm and normal heart sounds.   Pulmonary/Chest: Effort normal and breath sounds normal.  Musculoskeletal: Normal range of motion.  Neurological: He is alert and oriented to person, place, and time.  Skin: Skin is warm and dry.  Psychiatric: He has a normal mood and affect. His behavior is normal. Judgment and thought content normal.  Score of 39 ADHD scale    Vitals:   04/14/16 1442  BP: 130/78  Pulse: 69  Resp: 16  Temp: 98.4 F (36.9 C)     Assessment & Plan:  1. Attention deficit hyperactivity disorder (ADHD), predominantly inattentive type, Controlled/Stable  . amphetamine-dextroamphetamine (ADDERALL) 5 MG tablet    Sig: Take 2 tablets (10 mg total) by mouth daily.    Dispense:  60 tablet    Refill:  0    Order Specific Question:   Supervising Provider    Answer:   SHAW, EVA N [[id number]]    2. Cough, likely the result of an seasonal allergies. . benzonatate (TESSALON) 100 MG capsule    Sig: Take 1-2 capsules (100-200 mg total) by mouth 3 (three) times daily as needed for cough.  Dispense:  40 capsule    Refill:  0  . cetirizine (ZYRTEC) 10 MG tablet    Sig: Take 1 tablet (10 mg total) by mouth daily.    Dispense:  30 tablet    Refill:  11   3. Tobacco Dependency, Controlled . nicotine polacrilex (NICORETTE) 4 MG gum    Sig: Take 1 each (4 mg total) by mouth as needed for smoking cessation.    Dispense:  100 tablet    Refill:  6   Djon Tith S. Tiburcio Pea, MSN, FNP-C [facility]

## 2016-04-14 NOTE — Patient Instructions (Addendum)
Please return for medication follow-up in 3 months.    IF you received an x-ray today, you will receive an invoice from [facility]. Please contact [facility] at [phone] with questions or concerns regarding your invoice.   IF you received labwork today, you will receive an invoice from [facility]. Please contact [facility] at [phone] with questions or concerns regarding your invoice.   Our billing staff will not be able to assist you with questions regarding bills from these companies.  You will be contacted with the lab results as soon as they are available. The fastest way to get your results is to activate your My Chart account. Instructions are located on the last page of this paperwork. If you have not heard from us regarding the results in 2 weeks, please contact this office.

## 2016-04-19 MED ORDER — AMPHETAMINE-DEXTROAMPHETAMINE 5 MG PO TABS: 10.0000 mg | ORAL_TABLET | Freq: Every day | ORAL | 0 refills | Status: DC

## 2016-06-30 ENCOUNTER — Encounter (HOSPITAL_COMMUNITY): Payer: Self-pay | Admitting: *Deleted

## 2016-06-30 ENCOUNTER — Emergency Department (HOSPITAL_COMMUNITY): Payer: BLUE CROSS/BLUE SHIELD

## 2016-06-30 ENCOUNTER — Emergency Department (HOSPITAL_COMMUNITY)
Admission: EM | Admit: 2016-06-30 | Discharge: 2016-06-30 | Disposition: A | Discharge status: Home / Self Care | Payer: BLUE CROSS/BLUE SHIELD | Attending: Emergency Medicine | Admitting: Emergency Medicine

## 2016-06-30 DIAGNOSIS — B349 Viral infection, unspecified: Secondary | ICD-10-CM | POA: Insufficient documentation

## 2016-06-30 DIAGNOSIS — F909 Attention-deficit hyperactivity disorder, unspecified type: Secondary | ICD-10-CM | POA: Diagnosis not present

## 2016-06-30 DIAGNOSIS — J45909 Unspecified asthma, uncomplicated: Secondary | ICD-10-CM | POA: Diagnosis not present

## 2016-06-30 DIAGNOSIS — R509 Fever, unspecified: Secondary | ICD-10-CM | POA: Diagnosis present

## 2016-06-30 DIAGNOSIS — R791 Abnormal coagulation profile: Secondary | ICD-10-CM | POA: Insufficient documentation

## 2016-06-30 DIAGNOSIS — F17200 Nicotine dependence, unspecified, uncomplicated: Secondary | ICD-10-CM | POA: Diagnosis not present

## 2016-06-30 DIAGNOSIS — Z23 Encounter for immunization: Secondary | ICD-10-CM | POA: Diagnosis not present

## 2016-06-30 LAB — COMPREHENSIVE METABOLIC PANEL
ALBUMIN: 4.8 g/dL (ref 3.5–5.0)
ALK PHOS: 56 U/L (ref 38–126)
ALT: 23 U/L (ref 17–63)
ANION GAP: 13 (ref 5–15)
AST: 27 U/L (ref 15–41)
BILIRUBIN TOTAL: 0.4 mg/dL (ref 0.3–1.2)
BUN: 9 mg/dL (ref 6–20)
CALCIUM: 9.6 mg/dL (ref 8.9–10.3)
CO2: 22 mmol/L (ref 22–32)
CREATININE: 1.33 mg/dL — AB (ref 0.61–1.24)
Chloride: 96 mmol/L — ABNORMAL LOW (ref 101–111)
GFR calc Af Amer: >60 mL/min (ref >60)
GFR calc non Af Amer: >60 mL/min (ref >60)
GLUCOSE: 109 mg/dL — AB (ref 65–99)
Potassium: 4.6 mmol/L (ref 3.5–5.1)
SODIUM: 131 mmol/L — AB (ref 135–145)
TOTAL PROTEIN: 7.6 g/dL (ref 6.5–8.1)

## 2016-06-30 LAB — CBC WITH DIFFERENTIAL/PLATELET
BASOS PCT: 0 %
Basophils Absolute: 0 10*3/uL (ref 0.0–0.1)
EOS ABS: 0.1 10*3/uL (ref 0.0–0.7)
EOS PCT: 1 %
HCT: 40.8 % (ref 39.0–52.0)
HEMOGLOBIN: 14.1 g/dL (ref 13.0–17.0)
LYMPHS ABS: 1.2 10*3/uL (ref 0.7–4.0)
Lymphocytes Relative: 14 %
MCH: 30.9 pg (ref 26.0–34.0)
MCHC: 34.6 g/dL (ref 30.0–36.0)
MCV: 89.5 fL (ref 78.0–100.0)
MONO ABS: 0.5 10*3/uL (ref 0.1–1.0)
MONOS PCT: 6 %
NEUTROS PCT: 79 %
Neutro Abs: 6.7 10*3/uL (ref 1.7–7.7)
PLATELETS: 183 10*3/uL (ref 150–400)
RBC: 4.56 MIL/uL (ref 4.22–5.81)
RDW: 12.4 % (ref 11.5–15.5)
WBC: 8.6 10*3/uL (ref 4.0–10.5)

## 2016-06-30 LAB — URINALYSIS, ROUTINE W REFLEX MICROSCOPIC
Bilirubin Urine: NEGATIVE
GLUCOSE, UA: NEGATIVE mg/dL
KETONES UR: 40 mg/dL — AB
LEUKOCYTES UA: NEGATIVE
Nitrite: NEGATIVE
PH: 6 (ref 5.0–8.0)
PROTEIN: NEGATIVE mg/dL
Specific Gravity, Urine: 1.018 (ref 1.005–1.030)

## 2016-06-30 LAB — URINE MICROSCOPIC-ADD ON: WBC, UA: NONE SEEN WBC/hpf (ref 0–5)

## 2016-06-30 LAB — PROTIME-INR
INR: 0.97
PROTHROMBIN TIME: 12.9 s (ref 11.4–15.2)

## 2016-06-30 LAB — INFLUENZA PANEL BY PCR (TYPE A & B)
INFLBPCR: NEGATIVE
Influenza A By PCR: NEGATIVE

## 2016-06-30 LAB — I-STAT CG4 LACTIC ACID, ED: Lactic Acid, Venous: 0.87 mmol/L (ref 0.5–1.9)

## 2016-06-30 MED ORDER — TETANUS-DIPHTH-ACELL PERTUSSIS 5-2.5-18.5 LF-MCG/0.5 IM SUSP
0.5000 mL | Freq: Once | INTRAMUSCULAR | Status: AC
  Administered 2016-06-30: 0.5 mL via INTRAMUSCULAR
  Filled 2016-06-30: qty 0.5

## 2016-06-30 MED ORDER — IBUPROFEN 400 MG PO TABS
600.0000 mg | ORAL_TABLET | Freq: Once | ORAL | Status: AC
  Administered 2016-06-30: 600 mg via ORAL
  Filled 2016-06-30: qty 1

## 2016-06-30 MED ORDER — TETANUS-DIPHTH-ACELL PERTUSSIS 5-2.5-18.5 LF-MCG/0.5 IM SUSP
0.5000 mL | Freq: Once | INTRAMUSCULAR | Status: DC
Start: 2016-06-30 — End: 2016-06-30
  Filled 2016-06-30: qty 0.5

## 2016-06-30 MED ORDER — SODIUM CHLORIDE 0.9 % IV BOLUS (SEPSIS)
1000.0000 mL | Freq: Once | INTRAVENOUS | Status: AC
  Administered 2016-06-30: 1000 mL via INTRAVENOUS

## 2016-06-30 MED ORDER — ACETAMINOPHEN 500 MG PO TABS
1000.0000 mg | ORAL_TABLET | Freq: Once | ORAL | Status: AC
  Administered 2016-06-30: 1000 mg via ORAL
  Filled 2016-06-30: qty 2
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
## 2016-06-30 NOTE — ED Provider Notes (Signed)
[facility]-EMERGENCY DEPT Provider Note   CSN: [id number] Arrival date & time: 06/30/16  1117     History   Chief Complaint Chief Complaint  Patient presents with  . Fever  . Neck Pain  . Weakness    HPI Justin Underwood is a 35 y.o. male.  HPI  Patient is a 35 year old male with no pertinent past medical history who presents the ED with multiple complaints. Patient reports a week ago Sunday he was cutting his nail cuticles with his pocket knife. Patient reports later that day he began to have swelling, redness and pain to his left thumb. He notes he was seen at [facility], was given shot of antibiotics in the clinic and discharged home with oral antibiotics (Bactrim) which he has been taking over the past week. Patient reports he tried to I&D the infection himself using a razor blade and notes he was able to produce a small amount of bloody/clear fluid. Patient states then on Tuesday he accidentally shot a nail through the tip of his thumb resulting in more fluid draining. Patient reports since then he has had improvement of redness, swelling and pain to his left thumb. However patient reports over the past 2 days he started to have a gradual onset headache, neck pain/stiffness, chills, generalized body aches and joint pain, nonproductive cough. He also reports noticing a red rash to his thighs yesterday, denies itching or drainage. Patient reports he has been out hunting over the past few weeks. Denies any known sick contacts. Denies fever, lightheadedness, dizziness, visual changes, chest pain, shortness of breath, wheezing, abdominal pain, nausea, vomiting, diarrhea, urinary symptoms. Patient denies receiving a flu shot this year. Tetanus status unknown.  Past Medical History:  Diagnosis Date  . Anxiety   . Asthma     Patient Active Problem List   Diagnosis Date Noted  . Attention deficit hyperactivity disorder (ADHD) 03/11/2016  . Benign skin lesion of nose 04/15/2013     Past Surgical History:  Procedure Laterality Date  . FRACTURE SURGERY    . TOOTH EXTRACTION      OB History    No data available       Home Medications    Prior to Admission medications   Medication Sig Start Date End Date Taking? Authorizing Provider  Acetaminophen-Aspirin Buffered (EXCEDRIN BACK & BODY PO) Take 2 tablets by mouth every 6 (six) hours as needed (for pain).   Yes Historical Provider, MD  amphetamine-dextroamphetamine (ADDERALL) 5 MG tablet Take 2 tablets (10 mg total) by mouth daily. 06/14/16 07/14/16 Yes Doyle AskewKimberly Stephenia Harris, FNP  ibuprofen (ADVIL,MOTRIN) 200 MG tablet Take 800 mg by mouth every 6 (six) hours as needed for mild pain.   Yes Historical Provider, MD  sulfamethoxazole-trimethoprim (BACTRIM DS,SEPTRA DS) 800-160 MG tablet Take 1 tablet by mouth 2 (two) times daily. 06/22/16  Yes Historical Provider, MD  benzonatate (TESSALON) 100 MG capsule Take 1-2 capsules (100-200 mg total) by mouth 3 (three) times daily as needed for cough. Patient not taking: Reported on 06/30/2016 04/14/16   Doyle AskewKimberly Stephenia Harris, FNP  cetirizine (ZYRTEC) 10 MG tablet Take 1 tablet (10 mg total) by mouth daily. Patient not taking: Reported on 06/30/2016 04/14/16   Doyle AskewKimberly Stephenia Harris, FNP  nicotine polacrilex (NICORETTE) 4 MG gum Take 1 each (4 mg total) by mouth as needed for smoking cessation. Patient not taking: Reported on 06/30/2016 04/14/16   Doyle AskewKimberly Stephenia Harris, FNP    Family History Family History  Problem Relation Age  of Onset  . Hypertension Mother   . Hypertension Father   . Diabetes Maternal Grandfather   . Hypertension Maternal Grandfather   . Hyperlipidemia Maternal Grandfather     Social History Social History  Substance Use Topics  . Smoking status: Current Some Day Smoker  . Smokeless tobacco: Current User    Types: Chew, Snuff  . Alcohol use Yes     Comment: socially     Allergies   Oxycodone   Review of Systems Review  of Systems  Constitutional: Positive for chills.  Respiratory: Positive for cough.   Musculoskeletal: Positive for back pain, myalgias (generalized), neck pain and neck stiffness.  Skin: Positive for rash.  Neurological: Positive for headaches.  All other systems reviewed and are negative.    Physical Exam Updated Vital Signs BP 133/74   Pulse 74   Temp 101.4 F (38.6 C) (Oral)   Resp 18   SpO2 99%   Physical Exam  Constitutional: He is oriented to person, place, and time. He appears well-developed and well-nourished. No distress.  HENT:  Head: Normocephalic and atraumatic.  Mouth/Throat: Uvula is midline, oropharynx is clear and moist and mucous membranes are normal. No oropharyngeal exudate, posterior oropharyngeal edema, posterior oropharyngeal erythema or tonsillar abscesses. No tonsillar exudate.  Eyes: Conjunctivae and EOM are normal. Pupils are equal, round, and reactive to light. Right eye exhibits no discharge. Left eye exhibits no discharge. No scleral icterus.  No nystagmus  Neck: Normal range of motion. Neck supple. No Brudzinski's sign and no Kernig's sign noted.  FROM of neck.  Cardiovascular: Normal rate, regular rhythm, normal heart sounds and intact distal pulses.   Pulmonary/Chest: Effort normal and breath sounds normal. No respiratory distress. He has no wheezes. He has no rales. He exhibits no tenderness.  Abdominal: Soft. Bowel sounds are normal. He exhibits no distension and no mass. There is no tenderness. There is no rebound and no guarding. No hernia.  Musculoskeletal: Normal range of motion. He exhibits tenderness. He exhibits no edema.  No midline C, T, or L tenderness. TTP over bilateral cervical, thoracic and lumbar paraspinal muscles. Full range of motion of neck and back. Full range of motion of bilateral upper and lower extremities, with 5/5 strength. Sensation intact. 2+ radial and PT pulses. Cap refill <2 seconds. Patient able to stand and ambulate  without assistance.    Well healing callus-like lesion noted to tip of left thumb fingerpad. Small old subungual hematoma noted to left thumb, nail grossly intact. No evidence of felon or paronychia. No surrounding swelling, erythema, drainage, induration, fluctuance or warmth. FROM of left thumb with 5/5 strength. Sensation grossly intact. Cap refill <2.   Neurological: He is alert and oriented to person, place, and time. He has normal strength. No cranial nerve deficit or sensory deficit.  Skin: Skin is warm and dry. He is not diaphoretic.  Few small scattered erythematous papules noted to bilateral anterior thighs. No surrounding swelling, erythema, warmth, drainage, petechiae, purpura, vesicles, bulla. No lesions on palms or soles.   Nursing note and vitals reviewed.    ED Treatments / Results  Labs (all labs ordered are listed, but only abnormal results are displayed) Labs Reviewed  COMPREHENSIVE METABOLIC PANEL - Abnormal; Notable for the following:       Result Value   Sodium 131 (*)    Chloride 96 (*)    Glucose, Bld 109 (*)    Creatinine, Ser 1.33 (*)    All other components within normal  limits  URINALYSIS, ROUTINE W REFLEX MICROSCOPIC (NOT AT [facility]) - Abnormal; Notable for the following:    Hgb urine dipstick TRACE (*)    Ketones, ur 40 (*)    All other components within normal limits  URINE MICROSCOPIC-ADD ON - Abnormal; Notable for the following:    Squamous Epithelial / LPF 0-5 (*)    Bacteria, UA RARE (*)    All other components within normal limits  URINE CULTURE  CBC WITH DIFFERENTIAL/PLATELET  PROTIME-INR  INFLUENZA PANEL BY PCR (TYPE A & B, H1N1)  I-STAT CG4 LACTIC ACID, ED    EKG  EKG Interpretation None       Radiology Dg Chest 2 View  Result Date: 06/30/2016 CLINICAL DATA:  Fever and neck pain. EXAM: CHEST  2 VIEW COMPARISON:  None. FINDINGS: The heart size and mediastinal contours are within normal limits. Both lungs are clear. The visualized  skeletal structures are unremarkable. IMPRESSION: No active cardiopulmonary disease. Electronically Signed   By: Richarda Overlie M.D.   On: 06/30/2016 13:16   Dg Finger Thumb Left  Result Date: 06/30/2016 CLINICAL DATA:  Recent laceration with persistent swelling EXAM: LEFT THUMB 2+V COMPARISON:  October 22, 2007 FINDINGS: Frontal, oblique, and lateral views were obtained. Soft tissue irregularity distally is consistent with recent injury. Evidence of old injury to the distal aspect of first distal phalanx with mild remodeling. No acute fracture or dislocation. Joint spaces appear normal. No erosive change or bony destruction. No frank soft tissue abscess. IMPRESSION: Soft tissue injury distally. No acute fracture or dislocation. Evidence of old injury to the distal aspect of the first distal phalanx with remodeling. No erosive change or bony destruction. No evident arthropathy. Electronically Signed   By: Bretta Bang III M.D.   On: 06/30/2016 13:19    Procedures Procedures (including critical care time)  Medications Ordered in ED Medications  ibuprofen (ADVIL,MOTRIN) tablet 600 mg (not administered)  acetaminophen (TYLENOL) tablet 1,000 mg (1,000 mg Oral Given 06/30/16 1324)  sodium chloride 0.9 % bolus 1,000 mL (1,000 mLs Intravenous New Bag/Given 06/30/16 1520)  Tdap (BOOSTRIX) injection 0.5 mL (0.5 mLs Intramuscular Given 06/30/16 1538)     Initial Impression / Assessment and Plan / ED Course  I have reviewed the triage vital signs and the nursing notes.  Pertinent labs & imaging results that were available during my care of the patient were reviewed by me and considered in my medical decision making (see chart for details).  Clinical Course    Pt presents with chills, body aches, headache, neck stiffness, rash and low back pain for the past 2-3 days. Reports having an infection to his left thumb over a week ago which has improved since taking PO antibiotics. Tetanus UTD. Initial vitals  showed temp 101.4, HR 106, normotensive, O2 sat 100%. Pt given tylenol. Exam revealed tenderness palpation over bilateral cervical, thoracic and lumbar paraspinal muscles; no midline spinal tenderness. No neuro deficits. No meningeal signs, FROM of neck. Well healing callus-like lesion noted to tip of left thumb fingerpad without evidence of surrounding infection/cellulitis. No evidence of felon, paronychia or septic joint. Bilateral UE neurovascularly intact. Few small scattered erythematous papules noted to bilateral thighs. WBC 8.6. Lactic 0.87. Pt appears mildly dehydrated, given IVF. UA pending. CXR negative. Left thumb xray with soft tissue injury distally, no fx or dislocation. Tetanus updated in the ED. Dr. Rush Landmark evaluated pt. Suspect sxs are likely due to influenza. Do not suspect meningitis. Plan to order flu test due to  pt concern for exposure to his daughter at home, give IV and PO fluids. On reevaluation, pt resting comfortably in bed and tolerating PO. Flu negative. Plan to d/c pt home with symptomatic tx and outpatient follow up. Discussed strict return precautions.    Final Clinical Impressions(s) / ED Diagnoses   Final diagnoses:  Viral illness    New Prescriptions New Prescriptions   No medications on file     Barrett Henleicole Elizabeth Lendon George, PA-C 06/30/16 1703    Canary Brimhristopher J Tegeler, MD 06/30/16 [phone]

## 2016-06-30 NOTE — Discharge Instructions (Signed)
I recommend taking Tylenol and/or ibuprofen as prescribed over-the-counter, alternating between doses every 3-4 hours. Continue drinking fluids at home to remain hydrated. Follow-up with your primary care provider in the next 4-5 days if her symptoms have not improved. Please return to the Emergency Department if symptoms worsen or new onset of fever and headache, neck stiffness, visual changes, chest pain, difficulty breathing, abdominal pain, vomiting, unable to keep fluids down.

## 2016-07-01 LAB — URINE CULTURE: Culture: NO GROWTH

## 2016-09-01 ENCOUNTER — Telehealth: Payer: Self-pay

## 2016-09-01 NOTE — Telephone Encounter (Signed)
Please call patient with Justin Underwood' message.

## 2016-09-01 NOTE — Telephone Encounter (Signed)
Pt needs a refill of his adderall. [phone]

## 2016-09-01 NOTE — Telephone Encounter (Signed)
Please contact patient to advise he will need to come in the office for follow-up in order to have his medication refilled. That was indicated on the notes from his last follow-up.  Justin PickKimberly S. Tiburcio PeaHarris, MSN, FNP-C Primary Care at [facility] [phone]

## 2016-09-01 NOTE — Telephone Encounter (Signed)
Spoke with pt. Pt was advised that he would need to make a follow up appointment before getting another refill of his adderall. Pt stated that he understood. Pt was forwarded to scheduling department to make appointment.

## 2016-09-03 ENCOUNTER — Encounter: Payer: Self-pay | Admitting: Family Medicine

## 2016-09-03 ENCOUNTER — Ambulatory Visit (INDEPENDENT_AMBULATORY_CARE_PROVIDER_SITE_OTHER): Payer: BLUE CROSS/BLUE SHIELD | Admitting: Family Medicine

## 2016-09-03 VITALS — BP 127/74 | HR 74 | Temp 97.6°F | Resp 16 | Ht 71.0 in | Wt 177.8 lb

## 2016-09-03 DIAGNOSIS — F17200 Nicotine dependence, unspecified, uncomplicated: Secondary | ICD-10-CM

## 2016-09-03 DIAGNOSIS — F900 Attention-deficit hyperactivity disorder, predominantly inattentive type: Secondary | ICD-10-CM | POA: Diagnosis not present

## 2016-09-03 MED ORDER — AMPHETAMINE-DEXTROAMPHETAMINE 10 MG PO TABS: 10.0000 mg | ORAL_TABLET | Freq: Two times a day (BID) | ORAL | 0 refills | Status: AC

## 2016-09-03 MED ORDER — AMPHETAMINE-DEXTROAMPHETAMINE 5 MG PO TABS: 10.0000 mg | ORAL_TABLET | Freq: Every day | ORAL | 0 refills | Status: DC

## 2016-09-03 NOTE — Progress Notes (Signed)
Patient ID: Justin LecherWilliam R Murfin, male    DOB: [birth year], 36 y.o.   MRN: [id number]  PCP: Joaquin CourtsKimberly Akiva Josey, FNP  Chief Complaint  Patient presents with  . Medication Refill    adderall    Subjective:  HPI 36 year old male presents for evaluation of attention deficit disorder Last OV 04/14/2016. During that visit patient was given 3 months of ADHD medication  He reports over the last few months he has resumed dipping tobacco in spite of prior attempts to discontinue tobacco use. Reports that he smokes cigars at least two daily. Feels tobacco addiction is related not to boredom, but more so to passing time and fulfilling his pleasure center of his brain. He reports use of tobacco since high school years and reports that he would like to start back eventually on the nicotine gum to try and manage his tobacco addiction. He reports that he is working at [employer] recently. Denies any side effects of Adderall such as unintended weight loss, insomnia, or heart palpitations.   Social History   Social History  . Marital status: Single    Spouse name: N/A  . Number of children: N/A  . Years of education: N/A   Occupational History  . Not on file.   Social History Main Topics  . Smoking status: Current Some Day Smoker  . Smokeless tobacco: Current User    Types: Chew, Snuff  . Alcohol use Yes     Comment: socially  . Drug use: No  . Sexual activity: Yes   Other Topics Concern  . Not on file   Social History Narrative  . No narrative on file    Family History  Problem Relation Age of Onset  . Hypertension Mother   . Hypertension Father   . Diabetes Maternal Grandfather   . Hypertension Maternal Grandfather   . Hyperlipidemia Maternal Grandfather    Review of Systems See HPI Patient Active Problem List   Diagnosis Date Noted  . Attention deficit hyperactivity disorder (ADHD) 03/11/2016  . Benign skin lesion of nose 04/15/2013    Allergies  Allergen Reactions  . Oxycodone  Nausea And Vomiting    Delusions    Prior to Admission medications   Medication Sig Start Date End Date Taking? Authorizing Provider  Acetaminophen-Aspirin Buffered (EXCEDRIN BACK & BODY PO) Take 2 tablets by mouth every 6 (six) hours as needed (for pain).    Historical Provider, MD  amphetamine-dextroamphetamine (ADDERALL) 5 MG tablet Take 2 tablets (10 mg total) by mouth daily. 06/14/16 07/14/16  Doyle AskewKimberly Stephenia Ahsley Attwood, FNP  benzonatate (TESSALON) 100 MG capsule Take 1-2 capsules (100-200 mg total) by mouth 3 (three) times daily as needed for cough. Patient not taking: Reported on 06/30/2016 04/14/16   Doyle AskewKimberly Stephenia Asmaa Tirpak, FNP  cetirizine (ZYRTEC) 10 MG tablet Take 1 tablet (10 mg total) by mouth daily. Patient not taking: Reported on 06/30/2016 04/14/16   Doyle AskewKimberly Stephenia Jacalynn Buzzell, FNP  ibuprofen (ADVIL,MOTRIN) 200 MG tablet Take 800 mg by mouth every 6 (six) hours as needed for mild pain.    Historical Provider, MD  nicotine polacrilex (NICORETTE) 4 MG gum Take 1 each (4 mg total) by mouth as needed for smoking cessation. Patient not taking: Reported on 06/30/2016 04/14/16   Doyle AskewKimberly Stephenia Eleen Litz, FNP  sulfamethoxazole-trimethoprim (BACTRIM DS,SEPTRA DS) 800-160 MG tablet Take 1 tablet by mouth 2 (two) times daily. 06/22/16   Historical Provider, MD    Past Medical, Surgical Family and Social History reviewed and updated.  Objective:   Today's Vitals   09/03/16 0804  BP: 127/74  Pulse: 74  Resp: 16  Temp: 97.6 F (36.4 C)  TempSrc: Oral  SpO2: 99%  Weight: 177 lb 12.8 oz (80.6 kg)  Height: 5\' 11"  (1.803 m)    Wt Readings from Last 3 Encounters:  09/03/16 177 lb 12.8 oz (80.6 kg)  04/14/16 176 lb (79.8 kg)  03/11/16 181 lb (82.1 kg)    Physical Exam  Constitutional: He appears well-developed and well-nourished.  HENT:  Head: Normocephalic and atraumatic.  Negative for oral lesion or discoloration of oral mucosa  Neck: Normal range of motion. Neck supple.    Cardiovascular: Normal rate, regular rhythm, normal heart sounds and intact distal pulses.   Pulmonary/Chest: Effort normal and breath sounds normal.  Lymphadenopathy:    He has no cervical adenopathy.  Psychiatric: He has a normal mood and affect. His behavior is normal. Judgment and thought content normal.     Assessment & Plan:  1. Attention deficit hyperactivity disorder (ADHD), predominantly inattentive type -Three months of prescriptions issued. -Advised patient to schedule an appointment to establish care with Benny Lennert  2. Tobacco use disorder -Counseled about frequent assessments of oral mucosa and warning signs of malignancies. -Encouraged Cessation. -Pt is in contemplation stage    Godfrey Pick. Tiburcio Pea, MSN, FNP-C Primary Care at [facility] [phone]

## 2016-09-03 NOTE — Patient Instructions (Addendum)
Please schedule a follow-up with Benny LennertSarah Weber PA-C on or before 12/02/2016.     IF you received an x-ray today, you will receive an invoice from [facility]. Please contact [facility] at [phone] with questions or concerns regarding your invoice.   IF you received labwork today, you will receive an invoice from [facility]. Please contact [facility] at [phone] with questions or concerns regarding your invoice.   Our billing staff will not be able to assist you with questions regarding bills from these companies.  You will be contacted with the lab results as soon as they are available. The fastest way to get your results is to activate your My Chart account. Instructions are located on the last page of this paperwork. If you have not heard from us regarding the results in 2 weeks, please contact this office.      Tobacco Use Disorder Tobacco use disorder (TUD) is a mental disorder. It is the long-term use of tobacco in spite of related health problems or difficulty with normal life activities. Tobacco is most commonly smoked as cigarettes and less commonly as cigars or pipes. Smokeless chewing tobacco and snuff are also popular. People with TUD get a feeling of extreme pleasure (euphoria) from using tobacco and have a desire to use it again and again. Repeated use of tobacco can cause problems. The addictive effects of tobacco are due mainly tothe ingredient nicotine. Nicotine also causes a rush of adrenaline (epinephrine) in the body. This leads to increased blood pressure, heart rate, and breathing rate. These changes may cause problems for people with high blood pressure, weak hearts, or lung disease. High doses of nicotine in children and pets can lead to seizures and death. Tobacco contains a number of other unsafe chemicals. These chemicals are especially harmful when inhaled as smoke and can damage almost every organ in the body. Smokers live shorter lives than  nonsmokers and are at risk of dying from a number of diseases and cancers. Tobacco smoke can also cause health problems for nonsmokers (due to inhaling secondhand smoke). Smoking is also a fire hazard. TUD usually starts in the late teenage years and is most common in young adults between the ages of 3918 and 25 years. People who start smoking earlier in life are more likely to continue smoking as adults. TUD is somewhat more common in men than women. People with TUD are at higher risk for using alcohol and other drugs of abuse. What increases the risk? Risk factors for TUD include:  Having family members with the disorder.  Being around people who use tobacco.  Having an existing mental health issue such as schizophrenia, depression, bipolar disorder, ADHD, or posttraumatic stress disorder (PTSD). What are the signs or symptoms? People with tobacco use disorder have two or more of the following signs and symptoms within 12 months:  Use of more tobacco over a longer period than intended.  Not able to cut down or control tobacco use.  A lot of time spent obtaining or using tobacco.  Strong desire or urge to use tobacco (craving). Cravings may last for 6 months or longer after quitting.  Use of tobacco even when use leads to major problems at work, school, or home.  Use of tobacco even when use leads to relationship problems.  Giving up or cutting down on important life activities because of tobacco use.  Repeatedly using tobacco in situations where it puts you or others in physical danger, like smoking in bed.  Use  of tobacco even when it is known that a physical or mental problem is likely related to tobacco use.  Physical problems are numerous and may include chronic bronchitis, emphysema, lung and other cancers, gum disease, high blood pressure, heart disease, and stroke.  Mental problems caused by tobacco may include difficulty sleeping and anxiety.  Need to use greater amounts of  tobacco to get the same effect. This means you have developed a tolerance.  Withdrawal symptoms as a result of stopping or rapidly cutting back use. These symptoms may last a month or more after quitting and include the following:  Depressed, anxious, or irritable mood.  Difficulty concentrating.  Increased appetite.  Restlessness or trouble sleeping.  Use of tobacco to avoid withdrawal symptoms. How is this diagnosed? Tobacco use disorder is diagnosed by your health care provider. A diagnosis may be made by:  Your health care provider asking questions about your tobacco use and any problems it may be causing.  A physical exam.  Lab tests.  You may be referred to a mental health professional or addiction specialist. The severity of tobacco use disorder depends on the number of signs and symptoms you have:  Mild-Two or three symptoms.  Moderate-Four or five symptoms.  Severe-Six or more symptoms. How is this treated? Many people with tobacco use disorder are unable to quit on their own and need help. Treatment options include the following:  Nicotine replacement therapy (NRT). NRT provides nicotine without the other harmful chemicals in tobacco. NRT gradually lowers the dosage of nicotine in the body and reduces withdrawal symptoms. NRT is available in over-the-counter forms (gum, lozenges, and skin patches) as well as prescription forms (mouth inhaler and nasal spray).  Medicines.This may include:  Antidepressant medicine that may reduce nicotine cravings.  A medicine that acts on nicotine receptors in the brain to reduce cravings and withdrawal symptoms. It may also block the effects of tobacco in people with TUD who relapse.  Counseling or talk therapy. A form of talk therapy called behavioral therapy is commonly used to treat people with TUD. Behavioral therapy looks at triggers for tobacco use, how to avoid them, and how to cope with cravings. It is most effective in  person or by phone but is also available in self-help forms (books and Internet websites).  Support groups. These provide emotional support, advice, and guidance for quitting tobacco. The most effective treatment for TUD is usually a combination of medicine, talk therapy, and support groups. Follow these instructions at home:  Keep all follow-up visits as directed by your health care provider. This is important.  Take medicines only as directed by your health care provider.  Check with your health care provider before starting new prescription or over-the-counter medicines. Contact a health care provider if:  You are not able to take your medicines as prescribed.  Treatment is not helping your TUD and your symptoms get worse. Get help right away if:  You have serious thoughts about hurting yourself or others.  You have trouble breathing, chest pain, sudden weakness, or sudden numbness in part of your body. This information is not intended to replace advice given to you by your health care provider. Make sure you discuss any questions you have with your health care provider. Document Released: 03/26/2004 Document Revised: 03/23/2016 Document Reviewed: 09/16/2013 Elsevier Interactive Patient Education  2017 [employer].

## 2016-09-04 ENCOUNTER — Telehealth: Payer: Self-pay

## 2016-09-04 NOTE — Telephone Encounter (Signed)
LILIA FROM PHARMACY CALLING TO ASK ABOUT THE ADDERALL PRESCRIPTION SHE THINKS A DATE MAY HAVE BEEN MISREAD    PLEASE ADVISE  [phone]

## 2016-09-04 NOTE — Telephone Encounter (Signed)
Verified 3 rx for add meds were for feb, march and April.

## 2017-03-02 ENCOUNTER — Encounter (HOSPITAL_BASED_OUTPATIENT_CLINIC_OR_DEPARTMENT_OTHER): Payer: Self-pay | Admitting: *Deleted

## 2017-03-02 ENCOUNTER — Emergency Department (HOSPITAL_BASED_OUTPATIENT_CLINIC_OR_DEPARTMENT_OTHER)
Admission: EM | Admit: 2017-03-02 | Discharge: 2017-03-02 | Disposition: A | Discharge status: Home / Self Care | Payer: Worker's Compensation | Attending: Emergency Medicine | Admitting: Emergency Medicine

## 2017-03-02 DIAGNOSIS — Y9389 Activity, other specified: Secondary | ICD-10-CM | POA: Insufficient documentation

## 2017-03-02 DIAGNOSIS — Y990 Civilian activity done for income or pay: Secondary | ICD-10-CM | POA: Insufficient documentation

## 2017-03-02 DIAGNOSIS — T1591XA Foreign body on external eye, part unspecified, right eye, initial encounter: Secondary | ICD-10-CM

## 2017-03-02 DIAGNOSIS — W319XXA Contact with unspecified machinery, initial encounter: Secondary | ICD-10-CM | POA: Diagnosis not present

## 2017-03-02 DIAGNOSIS — H02813 Retained foreign body in right eye, unspecified eyelid: Secondary | ICD-10-CM | POA: Diagnosis not present

## 2017-03-02 DIAGNOSIS — S058X1A Other injuries of right eye and orbit, initial encounter: Secondary | ICD-10-CM | POA: Diagnosis present

## 2017-03-02 DIAGNOSIS — Z1812 Retained nonmagnetic metal fragments: Secondary | ICD-10-CM | POA: Diagnosis not present

## 2017-03-02 DIAGNOSIS — Y9269 Other specified industrial and construction area as the place of occurrence of the external cause: Secondary | ICD-10-CM | POA: Diagnosis not present

## 2017-03-02 DIAGNOSIS — J45909 Unspecified asthma, uncomplicated: Secondary | ICD-10-CM | POA: Insufficient documentation

## 2017-03-02 DIAGNOSIS — Z79899 Other long term (current) drug therapy: Secondary | ICD-10-CM | POA: Diagnosis not present

## 2017-03-02 DIAGNOSIS — F17210 Nicotine dependence, cigarettes, uncomplicated: Secondary | ICD-10-CM | POA: Diagnosis not present

## 2017-03-02 MED ORDER — POLYMYXIN B-TRIMETHOPRIM 10000-0.1 UNIT/ML-% OP SOLN: 1.0000 [drp] | OPHTHALMIC | 0 refills | Status: AC

## 2017-03-02 MED ORDER — KETOROLAC TROMETHAMINE 0.5 % OP SOLN: 1.0000 [drp] | Freq: Four times a day (QID) | OPHTHALMIC | 0 refills | Status: AC

## 2017-03-02 MED ORDER — TETRACAINE HCL 0.5 % OP SOLN
OPHTHALMIC | Status: AC
  Administered 2017-03-02: 22:00:00
  Filled 2017-03-02: qty 4

## 2017-03-02 MED ORDER — FLUORESCEIN SODIUM 0.6 MG OP STRP
ORAL_STRIP | OPHTHALMIC | Status: AC
  Administered 2017-03-02: 1
  Filled 2017-03-02: qty 1

## 2017-03-02 NOTE — Discharge Instructions (Signed)
Take the prescribed medication as directed. Follow-up with Dr. Dione BoozeGroat if you have any ongoing issues. Return to the ED for new or worsening symptoms.

## 2017-03-02 NOTE — ED Notes (Signed)
Pt verbalizes understanding of d/c instructions and denies any further needs at this time. 

## 2017-03-02 NOTE — ED Provider Notes (Signed)
[facility] Note   CSN: [id number] Arrival date & time: 03/02/17  2128  By signing my name below, I, Linna Darner, attest that this documentation has been prepared under the direction and in the presence of Sharilyn Sites, PA-C. Electronically Signed: Linna Darner, Scribe. 03/02/2017. 10:29 PM.  History   Chief Complaint Chief Complaint  Patient presents with  . Eye Injury   The history is provided by the patient. No language interpreter was used.    HPI Comments: Justin Underwood is a 36 y.o. male who presents to the Emergency Department complaining of a foreign body in his right eye. He states a single metal shaving became caught in his right eye at work. He endorses right eye pain and redness along with some photophobia. Patient tried to remove the foreign body with a Q-tip and tweezers prior to arrival but was unsuccessful. There are no alleviating factors noted. His tetanus status is UTD. He has no additional complaints at this time.   Past Medical History:  Diagnosis Date  . Anxiety   . Asthma     Patient Active Problem List   Diagnosis Date Noted  . Attention deficit hyperactivity disorder (ADHD) 03/11/2016  . Benign skin lesion of nose 04/15/2013    Past Surgical History:  Procedure Laterality Date  . FRACTURE SURGERY    . TOOTH EXTRACTION         Home Medications    Prior to Admission medications   Medication Sig Start Date End Date Taking? Authorizing Provider  amphetamine-dextroamphetamine (ADDERALL) 10 MG tablet Take 1 tablet (10 mg total) by mouth 2 (two) times daily. Do not refill before 10/03/2016 09/03/16  Yes Bing Neighbors, FNP  ibuprofen (ADVIL,MOTRIN) 200 MG tablet Take 800 mg by mouth every 6 (six) hours as needed for mild pain.   Yes [provider]  Acetaminophen-Aspirin Buffered (EXCEDRIN BACK & BODY PO) Take 2 tablets by mouth every 6 (six) hours as needed (for pain).    [provider]    amphetamine-dextroamphetamine (ADDERALL) 10 MG tablet Take 1 tablet (10 mg total) by mouth 2 (two) times daily. Do not refill before 10/03/2016 or after 11/02/2016 10/03/16 11/02/16  Bing Neighbors, FNP  amphetamine-dextroamphetamine (ADDERALL) 10 MG tablet Take 1 tablet (10 mg total) by mouth 2 (two) times daily. Do not fill before 4/1 or after 12/02/2016 11/02/16 12/02/16  Bing Neighbors, FNP  benzonatate (TESSALON) 100 MG capsule Take 1-2 capsules (100-200 mg total) by mouth 3 (three) times daily as needed for cough. Patient not taking: Reported on 06/30/2016 04/14/16   Bing Neighbors, FNP  cetirizine (ZYRTEC) 10 MG tablet Take 1 tablet (10 mg total) by mouth daily. Patient not taking: Reported on 06/30/2016 04/14/16   Bing Neighbors, FNP  nicotine polacrilex (NICORETTE) 4 MG gum Take 1 each (4 mg total) by mouth as needed for smoking cessation. Patient not taking: Reported on 06/30/2016 04/14/16   Bing Neighbors, FNP  sulfamethoxazole-trimethoprim (BACTRIM DS,SEPTRA DS) 800-160 MG tablet Take 1 tablet by mouth 2 (two) times daily. 06/22/16   [provider]    Family History Family History  Problem Relation Age of Onset  . Hypertension Mother   . Hypertension Father   . Diabetes Maternal Grandfather   . Hypertension Maternal Grandfather   . Hyperlipidemia Maternal Grandfather     Social History Social History  Substance Use Topics  . Smoking status: Current Some Day Smoker  . Smokeless tobacco: Current User  Types: Chew, Snuff  . Alcohol use Yes     Comment: socially     Allergies   Oxycodone   Review of Systems Review of Systems  Eyes: Positive for photophobia, pain (R) and redness (R).  Skin: Positive for wound (foreign body right eye).   Physical Exam Updated Vital Signs BP 107/89   Pulse 80   Temp 97.8 F (36.6 C) (Oral)   Resp 14   Ht 5' 11.75" (1.822 m)   Wt 177 lb (80.3 kg)   SpO2 99%   BMI 24.17 kg/m   Physical Exam  Constitutional:  He is oriented to person, place, and time. He appears well-developed and well-nourished. No distress.  HENT:  Head: Normocephalic and atraumatic.  Small piece of metal noted in right eye along right margin of the iris; there is no apparent rust ring noted; EOMs fully intact; conjunctiva injected and eye is tearing FB was removed and fluorescein stain completed without evidence of ulcer, abrasion, or uptake  Eyes: Conjunctivae and EOM are normal.  Neck: Neck supple. No tracheal deviation present.  Cardiovascular: Normal rate.   Pulmonary/Chest: Effort normal. No respiratory distress.  Musculoskeletal: Normal range of motion.  Neurological: He is alert and oriented to person, place, and time.  Skin: Skin is warm and dry.  Psychiatric: He has a normal mood and affect. His behavior is normal.  Nursing note and vitals reviewed.  ED Treatments / Results  Labs (all labs ordered are listed, but only abnormal results are displayed) Labs Reviewed - No data to display  EKG  EKG Interpretation None       Radiology No results found.  Procedures .Foreign Body Removal Date/Time: 03/02/2017 10:27 PM Performed by: Garlon HatchetSANDERS, LISA M Authorized by: Garlon HatchetSANDERS, LISA M  Consent: Verbal consent obtained. Risks and benefits: risks, benefits and alternatives were discussed Consent given by: patient Patient understanding: patient states understanding of the procedure being performed Patient consent: the patient's understanding of the procedure matches consent given Procedure consent: procedure consent matches procedure scheduled Patient identity confirmed: arm band and verbally with patient Body area: eye Location details: right conjunctiva  Sedation: Patient sedated: no Patient restrained: no Patient cooperative: yes Localization method: visualized Removal mechanism: moist cotton swab Eye examined with fluorescein. No residual rust ring present. Depth: superficial Complexity: simple 1  objects recovered. Objects recovered: one metal shaving Post-procedure assessment: foreign body removed Patient tolerance: Patient tolerated the procedure well with no immediate complications   (including critical care time)  DIAGNOSTIC STUDIES: Oxygen Saturation is 99% on RA, normal by my interpretation.    COORDINATION OF CARE: 10:26 PM Discussed treatment plan with pt at bedside and pt agreed to plan.  Medications Ordered in ED Medications  fluorescein 0.6 MG ophthalmic strip (1 strip  Given 03/02/17 2217)  tetracaine (PONTOCAINE) 0.5 % ophthalmic solution (  Given 03/02/17 2217)     Initial Impression / Assessment and Plan / ED Course  I have reviewed the triage vital signs and the nursing notes.  Pertinent labs & imaging results that were available during my care of the patient were reviewed by me and considered in my medical decision making (see chart for details).  36 year old male here with metal foreign body in right eye. Reports this happened before. On exam he does have a small piece of metal noted in her right eye along the right margin of the iris. There is no apparent rust ring. Conjunctivae is injected and eyes tearing. Tetracaine drops applied. Piece of metal  was easily removed with moist sterile Q-tip-- immediate relief of pain. Fluorescein stain performed afterwards without evidence of corneal ulcer, abrasion, or uptake. Patient's tetanus vaccine is up-to-date. He'll be placed on Acular and Polytrim drops for infection prophylaxis as well as comfort. He will follow-up with on-call ophthalmology if any ongoing issues.  Discussed plan with patient, he acknowledged understanding and agreed with plan of care.  Return precautions given for new or worsening symptoms.  Final Clinical Impressions(s) / ED Diagnoses   Final diagnoses:  Foreign body of right eye, initial encounter    New Prescriptions Discharge Medication List as of 03/02/2017 10:52 PM    START taking these  medications   Details  ketorolac (ACULAR) 0.5 % ophthalmic solution Place 1 drop into the right eye every 6 (six) hours., Starting Mon 03/02/2017, Print    trimethoprim-polymyxin b (POLYTRIM) ophthalmic solution Place 1 drop into the right eye every 4 (four) hours., Starting Mon 03/02/2017, Print       I personally performed the services described in this documentation, which was scribed in my presence. The recorded information has been reviewed and is accurate.   Garlon HatchetSanders, Lisa M, PA-C 03/02/17 2326    Vanetta MuldersZackowski, Scott, MD 03/07/17 [phone]

## 2017-03-02 NOTE — ED Triage Notes (Signed)
States he has a piece of metal shaving in his right eye.

## 2017-06-11 ENCOUNTER — Telehealth: Payer: Self-pay | Admitting: *Deleted

## 2017-06-11 NOTE — Telephone Encounter (Signed)
Received request for Medical Records from [employer]; forwarded to [place] for email/scan/SLS 11/08

## 2017-08-12 ENCOUNTER — Telehealth: Payer: Self-pay | Admitting: *Deleted

## 2017-08-12 NOTE — Telephone Encounter (Signed)
[facility] Records [facility]; forwarded to [place] for email/scan/SLS 01/09

## 2017-08-13 ENCOUNTER — Encounter (HOSPITAL_BASED_OUTPATIENT_CLINIC_OR_DEPARTMENT_OTHER): Payer: Self-pay | Admitting: *Deleted

## 2017-08-13 ENCOUNTER — Emergency Department (HOSPITAL_BASED_OUTPATIENT_CLINIC_OR_DEPARTMENT_OTHER)
Admission: EM | Admit: 2017-08-13 | Discharge: 2017-08-13 | Disposition: A | Discharge status: Home / Self Care | Payer: BLUE CROSS/BLUE SHIELD | Attending: Emergency Medicine | Admitting: Emergency Medicine

## 2017-08-13 ENCOUNTER — Other Ambulatory Visit: Payer: Self-pay

## 2017-08-13 DIAGNOSIS — F909 Attention-deficit hyperactivity disorder, unspecified type: Secondary | ICD-10-CM | POA: Insufficient documentation

## 2017-08-13 DIAGNOSIS — Y999 Unspecified external cause status: Secondary | ICD-10-CM | POA: Diagnosis not present

## 2017-08-13 DIAGNOSIS — F17290 Nicotine dependence, other tobacco product, uncomplicated: Secondary | ICD-10-CM | POA: Insufficient documentation

## 2017-08-13 DIAGNOSIS — Y9389 Activity, other specified: Secondary | ICD-10-CM | POA: Insufficient documentation

## 2017-08-13 DIAGNOSIS — Z79899 Other long term (current) drug therapy: Secondary | ICD-10-CM | POA: Insufficient documentation

## 2017-08-13 DIAGNOSIS — S61211A Laceration without foreign body of left index finger without damage to nail, initial encounter: Secondary | ICD-10-CM | POA: Diagnosis not present

## 2017-08-13 DIAGNOSIS — Y929 Unspecified place or not applicable: Secondary | ICD-10-CM | POA: Insufficient documentation

## 2017-08-13 DIAGNOSIS — S6992XA Unspecified injury of left wrist, hand and finger(s), initial encounter: Secondary | ICD-10-CM | POA: Diagnosis present

## 2017-08-13 DIAGNOSIS — J45909 Unspecified asthma, uncomplicated: Secondary | ICD-10-CM | POA: Diagnosis not present

## 2017-08-13 DIAGNOSIS — W268XXA Contact with other sharp object(s), not elsewhere classified, initial encounter: Secondary | ICD-10-CM | POA: Insufficient documentation

## 2017-08-13 MED ORDER — LIDOCAINE HCL (PF) 1 % IJ SOLN
5.0000 mL | Freq: Once | INTRAMUSCULAR | Status: AC
  Administered 2017-08-13: 5 mL via INTRADERMAL
  Filled 2017-08-13: qty 5

## 2017-08-13 MED ORDER — CEPHALEXIN 500 MG PO CAPS: 500.0000 mg | ORAL_CAPSULE | Freq: Four times a day (QID) | ORAL | 0 refills | Status: AC

## 2017-08-13 NOTE — ED Triage Notes (Signed)
Laceration to his left finger index finger. Pressure on arrival.

## 2017-08-13 NOTE — ED Notes (Signed)
Called Pt. About his leaving with out his script and discharge paperwork.  Pt. York SpanielSaid he will pick up at security tomorrow.

## 2017-08-13 NOTE — Discharge Instructions (Signed)
Take antibiotics as prescribed. Use Tylenol or ibuprofen as needed for pain. Use ice for swelling. Keep the dressing on for the next 24 hours.  After this, you may remove dressing and wash gently soap and water.  Reapply dressing and keep splint on to prevent movement of the finger.  Do not soak or submerge your hand in water until stitches are removed. Stitches should be removed in 10 days.  Come to the emergency room or urgent care or primary care for removal. Return sooner if you are developing fevers, pus draining from the cut, or any new or concerning symptoms.

## 2017-08-13 NOTE — ED Notes (Signed)
Laceration cart at chair side of Pt.

## 2017-08-13 NOTE — ED Provider Notes (Signed)
[facility] EMERGENCY DEPARTMENT Provider Note   CSN: [id number] Arrival date & time: 08/13/17  1914     History   Chief Complaint Chief Complaint  Patient presents with  . Laceration    HPI Justin Underwood is a 37 y.o. male presenting for evaluation of left index finger laceration.  Patient states he was using a wood chisel when it slipped and cut his finger.  He is evaluated at urgent care and, and told to come to the emergency room.  He denies numbness of the finger.  Has full active range of motion of the finger.  He is not on blood thinners.  Shot is up-to-date.  He has no other medical problems.  He has not taken anything for pain, but reports only mild pain worse with movement.  Nothing makes it better.  He denies injury elsewhere.  HPI  Past Medical History:  Diagnosis Date  . Anxiety   . Asthma     Patient Active Problem List   Diagnosis Date Noted  . Attention deficit hyperactivity disorder (ADHD) 03/11/2016  . Benign skin lesion of nose 04/15/2013    Past Surgical History:  Procedure Laterality Date  . FRACTURE SURGERY    . TOOTH EXTRACTION         Home Medications    Prior to Admission medications   Medication Sig Start Date End Date Taking? Authorizing Provider  Acetaminophen-Aspirin Buffered (EXCEDRIN BACK & BODY PO) Take 2 tablets by mouth every 6 (six) hours as needed (for pain).    [provider]  amphetamine-dextroamphetamine (ADDERALL) 10 MG tablet Take 1 tablet (10 mg total) by mouth 2 (two) times daily. Do not refill before 10/03/2016 or after 11/02/2016 10/03/16 11/02/16  Bing Neighbors, FNP  amphetamine-dextroamphetamine (ADDERALL) 10 MG tablet Take 1 tablet (10 mg total) by mouth 2 (two) times daily. Do not fill before 4/1 or after 12/02/2016 11/02/16 12/02/16  Bing Neighbors, FNP  amphetamine-dextroamphetamine (ADDERALL) 10 MG tablet Take 1 tablet (10 mg total) by mouth 2 (two) times daily. Do not refill before 10/03/2016 09/03/16    Bing Neighbors, FNP  benzonatate (TESSALON) 100 MG capsule Take 1-2 capsules (100-200 mg total) by mouth 3 (three) times daily as needed for cough. Patient not taking: Reported on 06/30/2016 04/14/16   Bing Neighbors, FNP  cephALEXin (KEFLEX) 500 MG capsule Take 1 capsule (500 mg total) by mouth 4 (four) times daily for 7 days. 08/13/17 08/20/17  Tyreonna Czaplicki, PA-C  cetirizine (ZYRTEC) 10 MG tablet Take 1 tablet (10 mg total) by mouth daily. Patient not taking: Reported on 06/30/2016 04/14/16   Bing Neighbors, FNP  ibuprofen (ADVIL,MOTRIN) 200 MG tablet Take 800 mg by mouth every 6 (six) hours as needed for mild pain.    [provider]  ketorolac (ACULAR) 0.5 % ophthalmic solution Place 1 drop into the right eye every 6 (six) hours. 03/02/17   Garlon Hatchet, PA-C  nicotine polacrilex (NICORETTE) 4 MG gum Take 1 each (4 mg total) by mouth as needed for smoking cessation. Patient not taking: Reported on 06/30/2016 04/14/16   Bing Neighbors, FNP  sulfamethoxazole-trimethoprim (BACTRIM DS,SEPTRA DS) 800-160 MG tablet Take 1 tablet by mouth 2 (two) times daily. 06/22/16   [provider]  trimethoprim-polymyxin b (POLYTRIM) ophthalmic solution Place 1 drop into the right eye every 4 (four) hours. 03/02/17   Garlon Hatchet, PA-C    Family History Family History  Problem Relation Age of Onset  .  Hypertension Mother   . Hypertension Father   . Diabetes Maternal Grandfather   . Hypertension Maternal Grandfather   . Hyperlipidemia Maternal Grandfather     Social History Social History   Tobacco Use  . Smoking status: Current Some Day Smoker  . Smokeless tobacco: Current User    Types: Chew, Snuff  Substance Use Topics  . Alcohol use: Yes    Comment: socially  . Drug use: No     Allergies   Oxycodone   Review of Systems Review of Systems  Skin: Positive for wound.  Allergic/Immunologic: Negative for immunocompromised state.  Neurological:  Negative for numbness.  Hematological: Does not bruise/bleed easily.     Physical Exam Updated Vital Signs BP (!) 142/89 (BP Location: Left Arm)   Pulse 77   Temp 97.9 F (36.6 C) (Oral)   Resp 18   Ht 5' 11.75" (1.822 m)   Wt 80.3 kg (177 lb)   SpO2 100%   BMI 24.17 kg/m   Physical Exam  Constitutional: He is oriented to person, place, and time. He appears well-developed and well-nourished. No distress.  HENT:  Head: Normocephalic and atraumatic.  Eyes: EOM are normal.  Neck: Normal range of motion.  Pulmonary/Chest: Effort normal.  Abdominal: He exhibits no distension.  Musculoskeletal: Normal range of motion.  Full active range of motion of the finger without difficulty.  Strength against resistance intact.  Sensation intact.  Radial pulses intact bilaterally.  Good cap refill.  Neurological: He is alert and oriented to person, place, and time.  Skin: Skin is warm. No rash noted.  1.5 cm laceration of the radial aspect of the left index finger crossing the DIP.  No active bleeding.  Psychiatric: He has a normal mood and affect.  Nursing note and vitals reviewed.    ED Treatments / Results  Labs (all labs ordered are listed, but only abnormal results are displayed) Labs Reviewed - No data to display  EKG  EKG Interpretation None       Radiology No results found.  Procedures .Marland Kitchen.Laceration Repair Date/Time: 08/13/2017 9:42 PM Performed by: Alveria Apleyaccavale, Juliyah Mergen, PA-C Authorized by: Alveria Apleyaccavale, Ludwin Flahive, PA-C   Consent:    Consent obtained:  Verbal   Consent given by:  Patient   Risks discussed:  Infection, pain and poor wound healing Anesthesia (see MAR for exact dosages):    Anesthesia method:  Local infiltration   Local anesthetic:  Lidocaine 1% w/o epi Laceration details:    Location:  Finger   Finger location:  L index finger   Length (cm):  1.5   Depth (mm):  5 Repair type:    Repair type:  Simple Exploration:    Wound exploration: wound explored  through full range of motion and entire depth of wound probed and visualized     Wound extent: no muscle damage noted, no nerve damage noted and no tendon damage noted   Treatment:    Area cleansed with:  Saline   Amount of cleaning:  Standard   Irrigation solution:  Sterile saline   Irrigation method:  Syringe Skin repair:    Repair method:  Sutures   Suture size:  6-0   Suture material:  Prolene   Suture technique:  Simple interrupted   Number of sutures:  3 Approximation:    Approximation:  Close Post-procedure details:    Dressing:  Bulky dressing and splint for protection   Patient tolerance of procedure:  Tolerated well, no immediate complications   (including critical  care time)  Medications Ordered in ED Medications  lidocaine (PF) (XYLOCAINE) 1 % injection 5 mL (5 mLs Intradermal Given 08/13/17 2147)     Initial Impression / Assessment and Plan / ED Course  I have reviewed the triage vital signs and the nursing notes.  Pertinent labs & imaging results that were available during my care of the patient were reviewed by me and considered in my medical decision making (see chart for details).     Patient presenting for evaluation of finger laceration.  Physical exam shows patient is neurovascularly intact.  Patient states he does not have time to wait for an x-ray.  Laceration sutured and splinted.  Given aftercare instructions.  Started on antibiotics.  Discussed concerns that patient may have a fracture of his finger, patient states he understands but he does not want an x-ray today.  At this time, patient appears safe for discharge.  Return precautions given.  Patient states he understands and agrees to plan.   Final Clinical Impressions(s) / ED Diagnoses   Final diagnoses:  Laceration of left index finger without foreign body without damage to nail, initial encounter    ED Discharge Orders        Ordered    cephALEXin (KEFLEX) 500 MG capsule  4 times daily      08/13/17 2128       Umaima Scholten, PA-C 08/14/17 0130    Vanetta Mulders, MD 08/14/17 2351

## 2017-09-09 IMAGING — DX DG FINGER THUMB 2+V*L*
3 series · 3 of 3 positions shown · non-contrast
Comparison: October 22, 2007

CLINICAL DATA: Recent laceration with persistent swelling

EXAM:
LEFT THUMB 2+V

[finger ap]
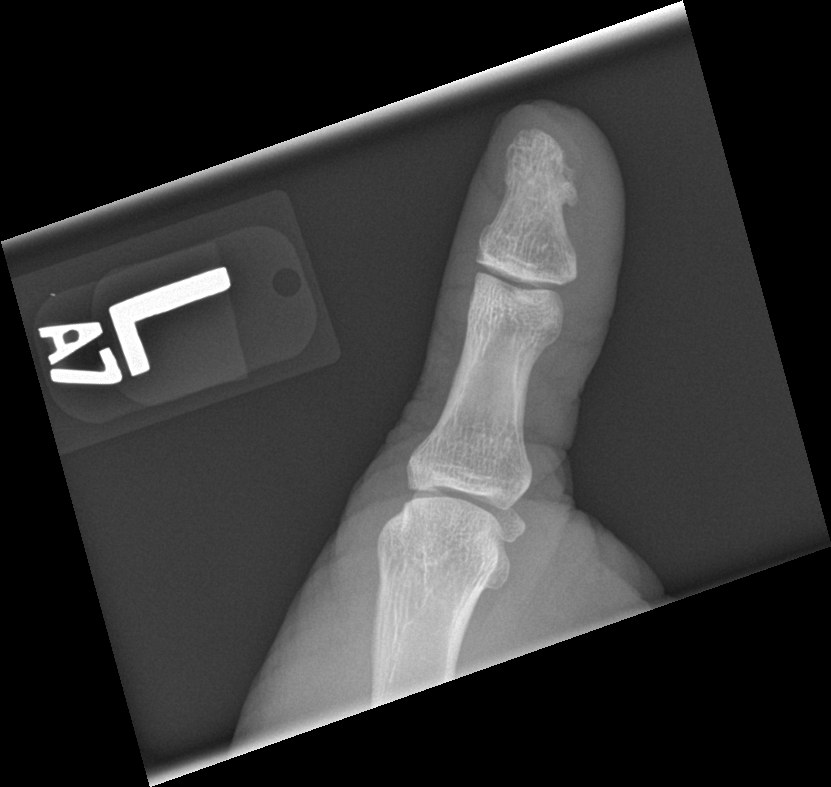

[finger obl]
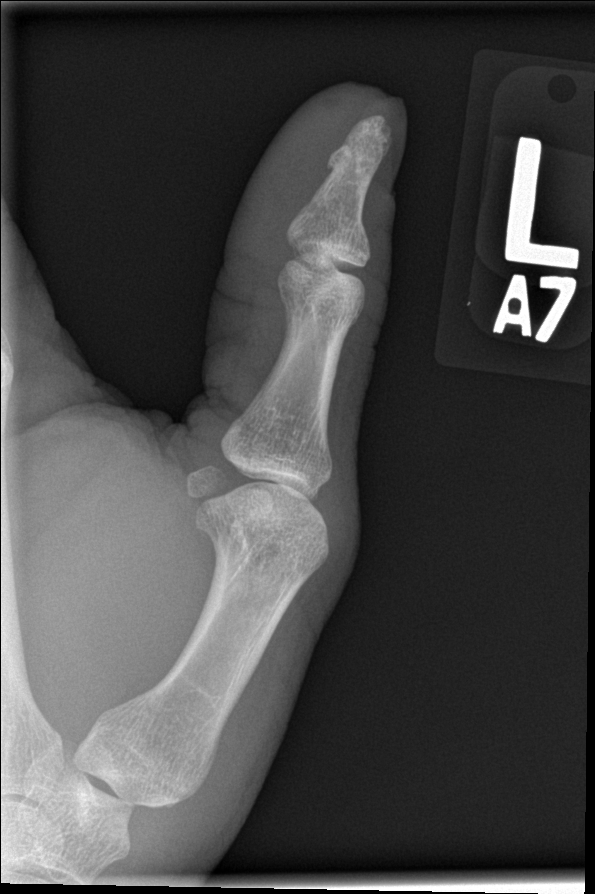

[finger lat]
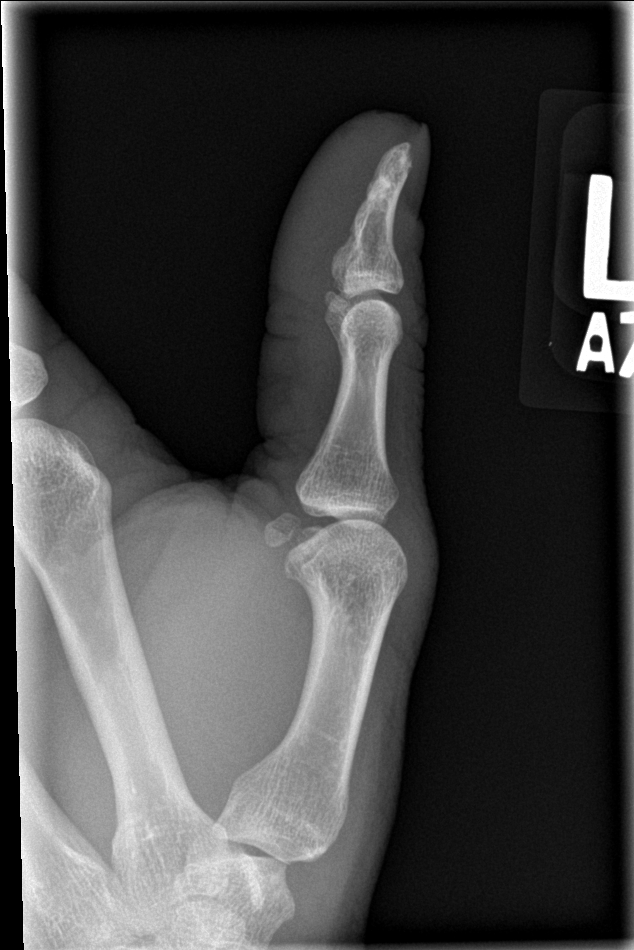

[3 of 3 positions shown; findings below may reference images not displayed]

FINDINGS: Frontal, oblique, and lateral views were obtained. Soft tissue
irregularity distally is consistent with recent injury. Evidence of
old injury to the distal aspect of first distal phalanx with mild
remodeling. No acute fracture or dislocation. Joint spaces appear
normal. No erosive change or bony destruction. No frank soft tissue
abscess.
IMPRESSION: Soft tissue injury distally. No acute fracture or dislocation.
Evidence of old injury to the distal aspect of the first distal
phalanx with remodeling. No erosive change or bony destruction. No
evident arthropathy.

## 2017-12-24 ENCOUNTER — Other Ambulatory Visit: Payer: Self-pay | Admitting: Gastroenterology

## 2017-12-25 ENCOUNTER — Other Ambulatory Visit: Payer: Self-pay | Admitting: Gastroenterology

## 2017-12-25 DIAGNOSIS — Z8719 Personal history of other diseases of the digestive system: Secondary | ICD-10-CM

## 2018-02-24 ENCOUNTER — Other Ambulatory Visit: Payer: Self-pay | Admitting: Gastroenterology

## 2018-05-15 ENCOUNTER — Ambulatory Visit (HOSPITAL_COMMUNITY)
Admission: EM | Admit: 2018-05-15 | Discharge: 2018-05-15 | Disposition: A | Discharge status: Home / Self Care | Payer: BLUE CROSS/BLUE SHIELD | Attending: Family Medicine | Admitting: Family Medicine

## 2018-05-15 ENCOUNTER — Encounter (HOSPITAL_COMMUNITY): Payer: Self-pay

## 2018-05-15 DIAGNOSIS — S41111A Laceration without foreign body of right upper arm, initial encounter: Secondary | ICD-10-CM | POA: Diagnosis not present

## 2018-05-15 DIAGNOSIS — W268XXA Contact with other sharp object(s), not elsewhere classified, initial encounter: Secondary | ICD-10-CM | POA: Diagnosis not present

## 2018-05-15 MED ORDER — LIDOCAINE-EPINEPHRINE (PF) 2 %-1:200000 IJ SOLN
INTRAMUSCULAR | Status: AC
  Filled 2018-05-15: qty 20

## 2018-05-15 MED ORDER — CEPHALEXIN 500 MG PO CAPS: 500.0000 mg | ORAL_CAPSULE | Freq: Four times a day (QID) | ORAL | 0 refills | Status: AC

## 2018-05-15 NOTE — ED Triage Notes (Signed)
Pt has a laceration on his right forearm. Cut it on metal cutting disc. This 2 hours ago

## 2018-05-15 NOTE — Discharge Instructions (Signed)
5 horizontal mattress, 1 simple interrupted sutures placed. Start keflex as directed for possible foreign body retention. You can remove current dressing in 24 hours. Keep wound clean and dry. You can clean gently with soap and water. Do not soak area in water. Monitor for spreading redness, increased warmth, increased swelling, fever, follow up for reevaluation needed. Otherwise follow up in 7 days for suture removal.

## 2018-05-15 NOTE — ED Provider Notes (Signed)
[facility]    CSN: [id number] Arrival date & time: 05/15/18  1138     History   Chief Complaint Chief Complaint  Patient presents with  . Laceration    HPI Justin Underwood is a 37 y.o. male.   37 year old male comes in for laceration of the right forearm.  Was using a metal cutter when the blade lacerated his arm.  Bleeding controlled by pressure.  Able to move fingers and wrist without problems.  Denies numbness, tingling of the fingers.  Last tetanus 3 years ago.     Past Medical History:  Diagnosis Date  . Anxiety   . Asthma     Patient Active Problem List   Diagnosis Date Noted  . Attention deficit hyperactivity disorder (ADHD) 03/11/2016  . Benign skin lesion of nose 04/15/2013    Past Surgical History:  Procedure Laterality Date  . FRACTURE SURGERY    . TOOTH EXTRACTION         Home Medications    Prior to Admission medications   Medication Sig Start Date End Date Taking? Authorizing Provider  Acetaminophen-Aspirin Buffered (EXCEDRIN BACK & BODY PO) Take 2 tablets by mouth every 6 (six) hours as needed (for pain).    [provider]  amphetamine-dextroamphetamine (ADDERALL) 10 MG tablet Take 1 tablet (10 mg total) by mouth 2 (two) times daily. Do not refill before 10/03/2016 or after 11/02/2016 10/03/16 11/02/16  Bing Neighbors, FNP  amphetamine-dextroamphetamine (ADDERALL) 10 MG tablet Take 1 tablet (10 mg total) by mouth 2 (two) times daily. Do not fill before 4/1 or after 12/02/2016 11/02/16 12/02/16  Bing Neighbors, FNP  amphetamine-dextroamphetamine (ADDERALL) 10 MG tablet Take 1 tablet (10 mg total) by mouth 2 (two) times daily. Do not refill before 10/03/2016 09/03/16   Bing Neighbors, FNP  benzonatate (TESSALON) 100 MG capsule Take 1-2 capsules (100-200 mg total) by mouth 3 (three) times daily as needed for cough. Patient not taking: Reported on 06/30/2016 04/14/16   Bing Neighbors, FNP  cephALEXin (KEFLEX) 500 MG capsule Take 1  capsule (500 mg total) by mouth 4 (four) times daily. 05/15/18   Cathie Hoops, Javeion Cannedy V, PA-C  cetirizine (ZYRTEC) 10 MG tablet Take 1 tablet (10 mg total) by mouth daily. Patient not taking: Reported on 06/30/2016 04/14/16   Bing Neighbors, FNP  ibuprofen (ADVIL,MOTRIN) 200 MG tablet Take 800 mg by mouth every 6 (six) hours as needed for mild pain.    [provider]  ketorolac (ACULAR) 0.5 % ophthalmic solution Place 1 drop into the right eye every 6 (six) hours. 03/02/17   Garlon Hatchet, PA-C  nicotine polacrilex (NICORETTE) 4 MG gum Take 1 each (4 mg total) by mouth as needed for smoking cessation. Patient not taking: Reported on 06/30/2016 04/14/16   Bing Neighbors, FNP  sulfamethoxazole-trimethoprim (BACTRIM DS,SEPTRA DS) 800-160 MG tablet Take 1 tablet by mouth 2 (two) times daily. 06/22/16   [provider]  trimethoprim-polymyxin b (POLYTRIM) ophthalmic solution Place 1 drop into the right eye every 4 (four) hours. 03/02/17   Garlon Hatchet, PA-C    Family History Family History  Problem Relation Age of Onset  . Hypertension Mother   . Hypertension Father   . Diabetes Maternal Grandfather   . Hypertension Maternal Grandfather   . Hyperlipidemia Maternal Grandfather     Social History Social History   Tobacco Use  . Smoking status: Current Some Day Smoker  . Smokeless tobacco: Current User  Types: Chew, Snuff  Substance Use Topics  . Alcohol use: Yes    Comment: socially  . Drug use: No     Allergies   Oxycodone   Review of Systems Review of Systems  Reason unable to perform ROS: See HPI as above.     Physical Exam Triage Vital Signs ED Triage Vitals [05/15/18 1216]  Enc Vitals Group     BP 130/86     Pulse Rate 76     Resp 18     Temp 97.8 F (36.6 C)     Temp Source Oral     SpO2 100 %     Weight 175 lb (79.4 kg)     Height      Head Circumference      Peak Flow      Pain Score      Pain Loc      Pain Edu?      Excl. in GC?     No data found.  Updated Vital Signs BP 130/86 (BP Location: Left Arm)   Pulse 76   Temp 97.8 F (36.6 C) (Oral)   Resp 18   Wt 175 lb (79.4 kg)   SpO2 100%   BMI 23.90 kg/m   Physical Exam  Constitutional: He is oriented to person, place, and time. He appears well-developed and well-nourished. No distress.  HENT:  Head: Normocephalic and atraumatic.  Eyes: Pupils are equal, round, and reactive to light. Conjunctivae are normal.  Musculoskeletal:  5cm and 4 cm laceration to the right forearm. See picture below. Bleeding controlled by pressure. Full ROM of wrist and fingers. Sensation intact. Radial pulse 2+, cap refill <2s  Neurological: He is alert and oriented to person, place, and time.  Skin: He is not diaphoretic.       UC Treatments / Results  Labs (all labs ordered are listed, but only abnormal results are displayed) Labs Reviewed - No data to display  EKG None  Radiology No results found.  Procedures Laceration Repair Date/Time: 05/15/2018 1:48 PM Performed by: Belinda Fisher, PA-C Authorized by: Mardella Layman, MD   Consent:    Consent obtained:  Verbal   Consent given by:  Patient   Risks discussed:  Infection, pain, retained foreign body, poor cosmetic result and poor wound healing   Alternatives discussed:  Referral Anesthesia (see MAR for exact dosages):    Anesthesia method:  Local infiltration   Local anesthetic:  Lidocaine 2% WITH epi Laceration details:    Location:  Shoulder/arm   Shoulder/arm location:  R lower arm   Wound length (cm): 5cm and 4cm.   Depth (mm):  3 Repair type:    Repair type:  Complex Pre-procedure details:    Preparation:  Patient was prepped and draped in usual sterile fashion Exploration:    Wound extended: Removed skin from between the 2 lacerations, creating elipical wound.     Hemostasis achieved with:  Direct pressure and epinephrine   Wound exploration: wound explored through full range of motion and entire  depth of wound probed and visualized   Treatment:    Area cleansed with:  Hibiclens   Amount of cleaning:  Extensive   Irrigation solution:  Sterile water   Irrigation method:  Pressure wash   Visualized foreign bodies/material removed: yes   Skin repair:    Repair method:  Sutures   Suture size:  4-0   Suture material:  Nylon   Suture technique:  Simple interrupted and horizontal  mattress   Number of sutures: 5 horizontal mattress, 1 simple interrupted. Approximation:    Approximation:  Close Post-procedure details:    Dressing:  Antibiotic ointment and bulky dressing   Patient tolerance of procedure:  Tolerated well, no immediate complications   (including critical care time)  Medications Ordered in UC Medications - No data to display  Initial Impression / Assessment and Plan / UC Course  I have reviewed the triage vital signs and the nursing notes.  Pertinent labs & imaging results that were available during my care of the patient were reviewed by me and considered in my medical decision making (see chart for details).    Patient tolerated procedure well. 5 horizontal mattress, 1 simple interrupted sutures placed. Will have patient start keflex for possible retained foreign body. Wound care instructions given. Return precautions given. Otherwise, follow up in 7 days for suture removal. Patient expresses understanding and agrees to plan.   Final Clinical Impressions(s) / UC Diagnoses   Final diagnoses:  Laceration of right upper extremity, initial encounter    ED Prescriptions    Medication Sig Dispense Auth. Provider   cephALEXin (KEFLEX) 500 MG capsule Take 1 capsule (500 mg total) by mouth 4 (four) times daily. 28 capsule Threasa Alpha, [place] 05/15/18 1449

## 2021-01-19 ENCOUNTER — Emergency Department (HOSPITAL_BASED_OUTPATIENT_CLINIC_OR_DEPARTMENT_OTHER): Payer: BC Managed Care – PPO

## 2021-01-19 ENCOUNTER — Encounter (HOSPITAL_BASED_OUTPATIENT_CLINIC_OR_DEPARTMENT_OTHER): Payer: Self-pay

## 2021-01-19 ENCOUNTER — Emergency Department (HOSPITAL_BASED_OUTPATIENT_CLINIC_OR_DEPARTMENT_OTHER)
Admission: EM | Admit: 2021-01-19 | Discharge: 2021-01-19 | Disposition: A | Discharge status: Home / Self Care | Payer: BC Managed Care – PPO | Attending: Emergency Medicine | Admitting: Emergency Medicine

## 2021-01-19 DIAGNOSIS — J45909 Unspecified asthma, uncomplicated: Secondary | ICD-10-CM | POA: Insufficient documentation

## 2021-01-19 DIAGNOSIS — F17200 Nicotine dependence, unspecified, uncomplicated: Secondary | ICD-10-CM | POA: Diagnosis not present

## 2021-01-19 DIAGNOSIS — J069 Acute upper respiratory infection, unspecified: Secondary | ICD-10-CM | POA: Diagnosis not present

## 2021-01-19 DIAGNOSIS — R059 Cough, unspecified: Secondary | ICD-10-CM | POA: Diagnosis present

## 2021-01-19 MED ORDER — BENZONATATE 100 MG PO CAPS: 100.0000 mg | ORAL_CAPSULE | Freq: Three times a day (TID) | ORAL | 0 refills | Status: AC

## 2021-01-19 MED ORDER — IBUPROFEN 600 MG PO TABS: 600.0000 mg | ORAL_TABLET | Freq: Three times a day (TID) | ORAL | 0 refills | Status: AC | PRN

## 2021-01-19 MED ORDER — GUAIFENESIN ER 1200 MG PO TB12: 1.0000 | ORAL_TABLET | Freq: Two times a day (BID) | ORAL | 0 refills | Status: AC

## 2021-01-19 NOTE — ED Triage Notes (Signed)
He c/o productive cough (green stuff) x 4-5 days. He is in no  distress.

## 2021-01-19 NOTE — Discharge Instructions (Addendum)
Take the medications as needed for cough and congestion.  Follow-up with your doctor next week to be rechecked if not improving

## 2021-01-19 NOTE — ED Provider Notes (Signed)
[facility] EMERGENCY DEPT Provider Note   CSN: [id number] Arrival date & time: 01/19/21  0750     History Chief Complaint  Patient presents with   URI    Justin Underwood is a 40 y.o. male.   URI  Patient presented to the ED for evaluation of persistent cough.  Patient states his symptoms started about 4 5 days ago.  He has been coughing.  He denies any fevers.  He denies any shortness of breath.  In the last couple of days however he started having pain in his chest with coughing.  He also has been bringing up green mucus.  Patient denies any recent COVID exposure.  He states other family members in the household are not sick.  Patient is worried that he might have pneumonia so he came to the ED.  Patient does have history of irritable bowel syndrome and is on immunosuppressive agents  Past Medical History:  Diagnosis Date   Anxiety    Asthma    Inflammatory bowel disease     Patient Active Problem List   Diagnosis Date Noted   Attention deficit hyperactivity disorder (ADHD) 03/11/2016   Benign skin lesion of nose 04/15/2013    Past Surgical History:  Procedure Laterality Date   FRACTURE SURGERY     TOOTH EXTRACTION         Family History  Problem Relation Age of Onset   Hypertension Mother    Hypertension Father    Diabetes Maternal Grandfather    Hypertension Maternal Grandfather    Hyperlipidemia Maternal Grandfather     Social History   Tobacco Use   Smoking status: Some Days    Pack years: 0.00   Smokeless tobacco: Current    Types: Chew, Snuff  Substance Use Topics   Alcohol use: Yes    Comment: socially   Drug use: No    Home Medications Prior to Admission medications   Medication Sig Start Date End Date Taking? Authorizing Provider  benzonatate (TESSALON) 100 MG capsule Take 1 capsule (100 mg total) by mouth every 8 (eight) hours. 01/19/21  Yes Linwood Dibbles, MD  Guaifenesin 1200 MG TB12 Take 1 tablet (1,200 mg total) by mouth 2  (two) times daily at 10 AM and 5 PM for 7 days. 01/19/21 01/26/21 Yes Linwood Dibbles, MD  ibuprofen (ADVIL) 600 MG tablet Take 1 tablet (600 mg total) by mouth every 8 (eight) hours as needed for up to 7 days for fever, headache, mild pain or moderate pain. 01/19/21 01/26/21 Yes Linwood Dibbles, MD  Acetaminophen-Aspirin Buffered [facility] BACK & BODY PO) Take 2 tablets by mouth every 6 (six) hours as needed (for pain).    [provider]  amphetamine-dextroamphetamine (ADDERALL) 10 MG tablet Take 1 tablet (10 mg total) by mouth 2 (two) times daily. Do not refill before 10/03/2016 or after 11/02/2016 10/03/16 11/02/16  Bing Neighbors, FNP  amphetamine-dextroamphetamine (ADDERALL) 10 MG tablet Take 1 tablet (10 mg total) by mouth 2 (two) times daily. Do not fill before 4/1 or after 12/02/2016 11/02/16 12/02/16  Bing Neighbors, FNP  amphetamine-dextroamphetamine (ADDERALL) 10 MG tablet Take 1 tablet (10 mg total) by mouth 2 (two) times daily. Do not refill before 10/03/2016 09/03/16   Bing Neighbors, FNP  cephALEXin (KEFLEX) 500 MG capsule Take 1 capsule (500 mg total) by mouth 4 (four) times daily. 05/15/18   Cathie Hoops, Amy V, PA-C  cetirizine (ZYRTEC) 10 MG tablet Take 1 tablet (10 mg total) by mouth  daily. Patient not taking: Reported on 06/30/2016 04/14/16   Bing Neighbors, FNP  ketorolac (ACULAR) 0.5 % ophthalmic solution Place 1 drop into the right eye every 6 (six) hours. 03/02/17   Garlon Hatchet, PA-C  nicotine polacrilex (NICORETTE) 4 MG gum Take 1 each (4 mg total) by mouth as needed for smoking cessation. Patient not taking: Reported on 06/30/2016 04/14/16   Bing Neighbors, FNP  sulfamethoxazole-trimethoprim (BACTRIM DS,SEPTRA DS) 800-160 MG tablet Take 1 tablet by mouth 2 (two) times daily. 06/22/16   [provider]  trimethoprim-polymyxin b (POLYTRIM) ophthalmic solution Place 1 drop into the right eye every 4 (four) hours. 03/02/17   Garlon Hatchet, PA-C  Azathioprine  Allergies     Oxycodone  Review of Systems   Review of Systems  All other systems reviewed and are negative.  Physical Exam Updated Vital Signs BP (!) 153/96 (BP Location: Right Arm)   Pulse 81   Temp 98 F (36.7 C)   Resp 18   SpO2 97%   Physical Exam Vitals and nursing note reviewed.  Constitutional:      General: He is not in acute distress.    Appearance: He is well-developed.  HENT:     Head: Normocephalic and atraumatic.     Right Ear: External ear normal.     Left Ear: External ear normal.  Eyes:     General: No scleral icterus.       Right eye: No discharge.        Left eye: No discharge.     Conjunctiva/sclera: Conjunctivae normal.  Neck:     Trachea: No tracheal deviation.  Cardiovascular:     Rate and Rhythm: Normal rate and regular rhythm.  Pulmonary:     Effort: Pulmonary effort is normal. No respiratory distress.     Breath sounds: Normal breath sounds. No stridor. No wheezing or rales.  Abdominal:     General: Bowel sounds are normal. There is no distension.     Palpations: Abdomen is soft.     Tenderness: There is no abdominal tenderness. There is no guarding or rebound.  Musculoskeletal:        General: No tenderness or deformity.     Cervical back: Neck supple.  Skin:    General: Skin is warm and dry.     Findings: No rash.  Neurological:     General: No focal deficit present.     Mental Status: He is alert.     Cranial Nerves: No cranial nerve deficit (no facial droop, extraocular movements intact, no slurred speech).     Sensory: No sensory deficit.     Motor: No abnormal muscle tone or seizure activity.     Coordination: Coordination normal.  Psychiatric:        Mood and Affect: Mood normal.    ED Results / Procedures / Treatments   Labs (all labs ordered are listed, but only abnormal results are displayed) Labs Reviewed - No data to display  EKG None  Radiology DG Chest Portable 1 View  Result Date: 01/19/2021 CLINICAL DATA:  Productive  cough for 5 days. EXAM: PORTABLE CHEST 1 VIEW COMPARISON:  06/30/2016 FINDINGS: The heart size and mediastinal contours are within normal limits. Both lungs are clear. The visualized skeletal structures are unremarkable. IMPRESSION: No active disease. Electronically Signed   By: Danae Orleans M.D.   On: 01/19/2021 08:45    Procedures Procedures   Medications Ordered in ED Medications - No data  to display  ED Course  I have reviewed the triage vital signs and the nursing notes.  Pertinent labs & imaging results that were available during my care of the patient were reviewed by me and considered in my medical decision making (see chart for details).  Clinical Course as of 01/19/21 0900  Sat Jan 19, 2021  0813 Discussed covid testing.  Pt declines testing today.  [JK]  0850 Chest x-ray negative for acute process [JK]    Clinical Course User Index [JK] Linwood Dibbles, MD   MDM Rules/Calculators/A&P                          Chest x-ray does not show pneumonia or pneumothorax.  PERC negative.  Doubt PE.  Symptoms are suggestive of upper respiratory infection.  Patient declined COVID testing.  Will discharge home with prescriptions for Tessalon and guaifenesin ibuprofen Final Clinical Impression(s) / ED Diagnoses Final diagnoses:  Upper respiratory tract infection, unspecified type    Rx / DC Orders ED Discharge Orders          Ordered    benzonatate (TESSALON) 100 MG capsule  Every 8 hours        01/19/21 0900    Guaifenesin 1200 MG TB12  2 times daily        01/19/21 0900    ibuprofen (ADVIL) 600 MG tablet  Every 8 hours PRN        01/19/21 0900             Linwood Dibbles, MD 01/19/21 [phone]

## 2022-03-31 IMAGING — DX DG CHEST 1V PORT
1 series · 1 of 1 positions shown · non-contrast
Comparison: 06/30/2016

CLINICAL DATA: Productive cough for 5 days.

EXAM:
PORTABLE CHEST 1 VIEW

[chest]
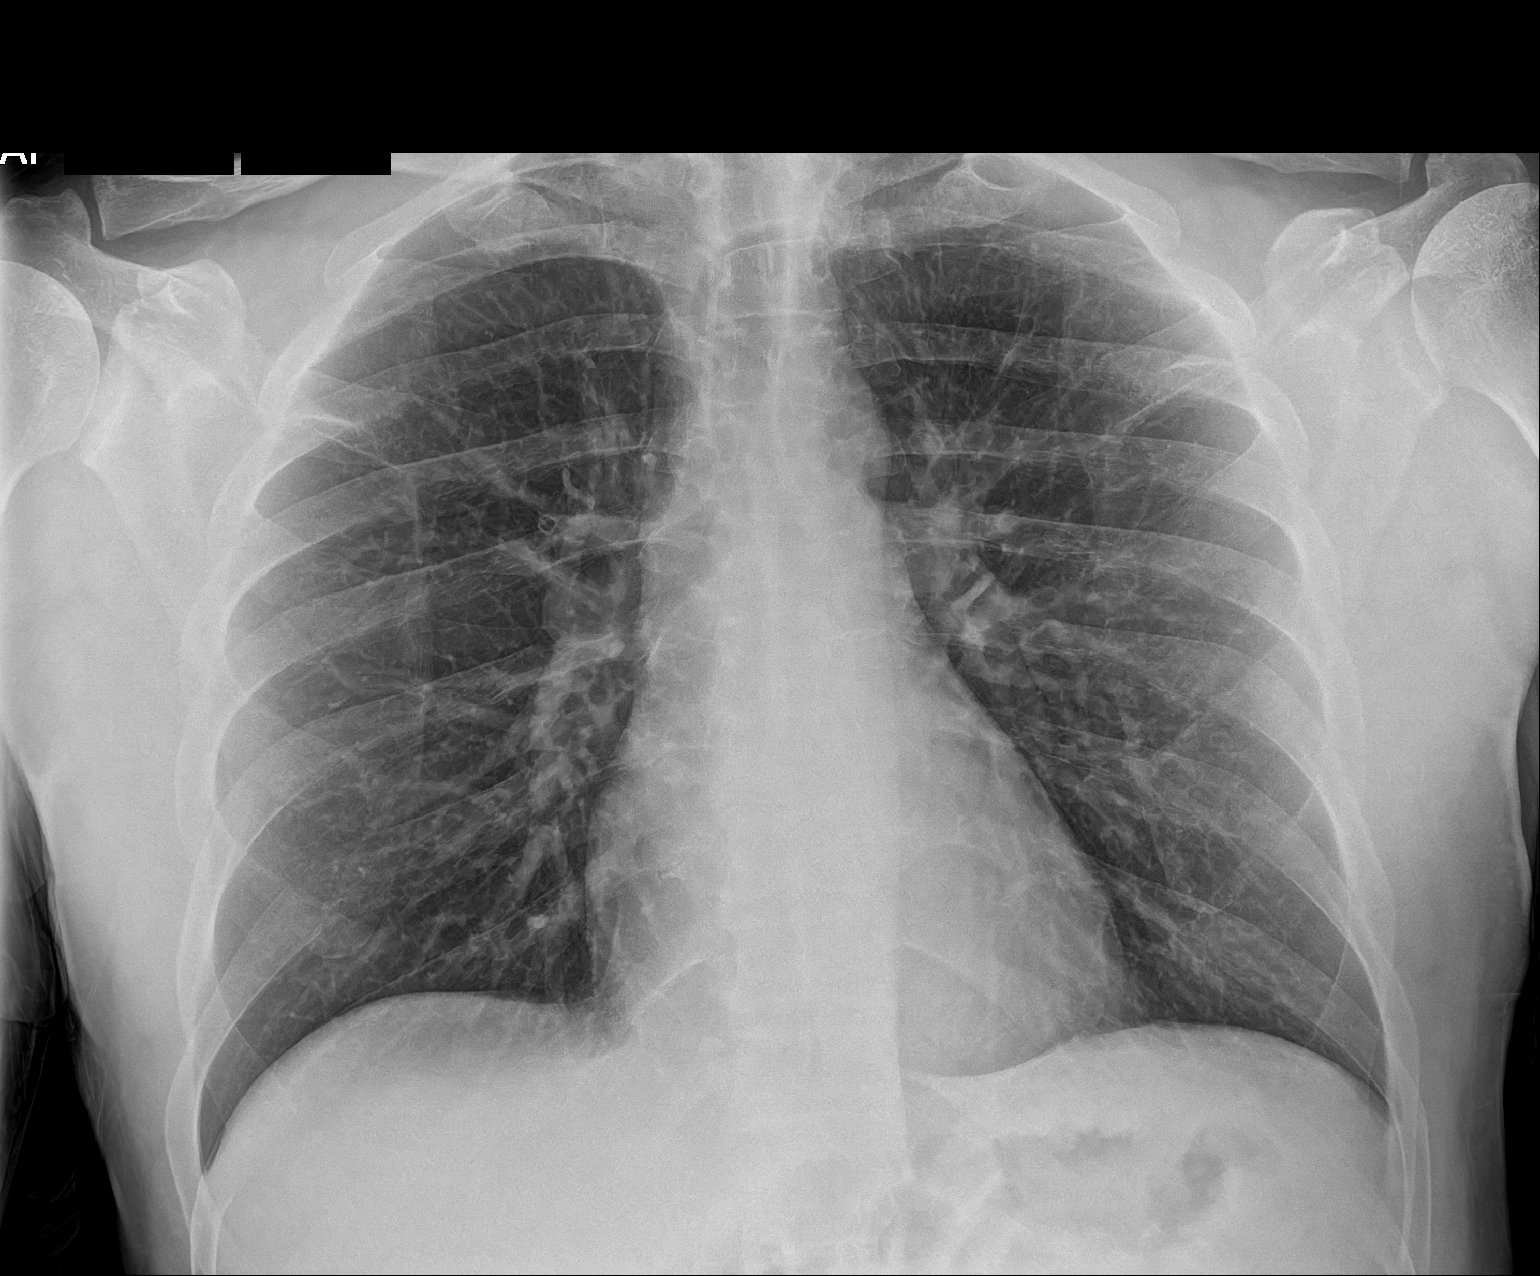

[1 of 1 positions shown; findings below may reference images not displayed]

FINDINGS: The heart size and mediastinal contours are within normal limits.
Both lungs are clear. The visualized skeletal structures are
unremarkable.
IMPRESSION: No active disease.
